# Patient Record
Sex: Male | Born: 1955 | Race: Black or African American | Hispanic: No | Marital: Married | Smoking: Current every day smoker
Health system: Southern US, Community
[De-identification: ages and names within clinical notes are randomized; demographics above are authoritative.]

## PROBLEM LIST (undated history)

## (undated) DIAGNOSIS — I10 Essential (primary) hypertension: Secondary | ICD-10-CM

## (undated) DIAGNOSIS — M5030 Other cervical disc degeneration, unspecified cervical region: Secondary | ICD-10-CM

## (undated) DIAGNOSIS — M1990 Unspecified osteoarthritis, unspecified site: Secondary | ICD-10-CM

## (undated) DIAGNOSIS — M5430 Sciatica, unspecified side: Secondary | ICD-10-CM

## (undated) DIAGNOSIS — G8929 Other chronic pain: Secondary | ICD-10-CM

---

## 2001-07-13 ENCOUNTER — Emergency Department (HOSPITAL_COMMUNITY): Admission: EM | Admit: 2001-07-13 | Discharge: 2001-07-13 | Payer: Self-pay

## 2001-07-13 ENCOUNTER — Encounter: Payer: Self-pay | Admitting: Emergency Medicine

## 2003-01-14 ENCOUNTER — Encounter: Payer: Self-pay | Admitting: Occupational Medicine

## 2003-01-14 ENCOUNTER — Encounter: Admission: RE | Admit: 2003-01-14 | Discharge: 2003-01-14 | Payer: Self-pay | Admitting: Occupational Medicine

## 2003-10-17 ENCOUNTER — Emergency Department (HOSPITAL_COMMUNITY): Admission: EM | Admit: 2003-10-17 | Discharge: 2003-10-18 | Payer: Self-pay | Admitting: Emergency Medicine

## 2006-12-20 ENCOUNTER — Emergency Department (HOSPITAL_COMMUNITY): Admission: EM | Admit: 2006-12-20 | Discharge: 2006-12-21 | Payer: Self-pay | Admitting: Emergency Medicine

## 2006-12-21 ENCOUNTER — Inpatient Hospital Stay (HOSPITAL_COMMUNITY): Admission: EM | Admit: 2006-12-21 | Discharge: 2006-12-21 | Payer: Self-pay | Admitting: Psychiatry

## 2006-12-21 ENCOUNTER — Ambulatory Visit: Payer: Self-pay | Admitting: Psychiatry

## 2009-04-23 ENCOUNTER — Emergency Department (HOSPITAL_COMMUNITY): Admission: EM | Admit: 2009-04-23 | Discharge: 2009-04-24 | Payer: Self-pay | Admitting: Emergency Medicine

## 2010-10-17 ENCOUNTER — Encounter: Payer: Self-pay | Admitting: Family Medicine

## 2011-01-02 LAB — URINALYSIS, ROUTINE W REFLEX MICROSCOPIC
Bilirubin Urine: NEGATIVE
Glucose, UA: NEGATIVE mg/dL
Hgb urine dipstick: NEGATIVE
Ketones, ur: NEGATIVE mg/dL
Nitrite: NEGATIVE
Protein, ur: NEGATIVE mg/dL
Specific Gravity, Urine: 1.015 (ref 1.005–1.030)
Urobilinogen, UA: 0.2 mg/dL (ref 0.0–1.0)
pH: 6.5 (ref 5.0–8.0)

## 2014-09-08 ENCOUNTER — Emergency Department (HOSPITAL_COMMUNITY)
Admission: EM | Admit: 2014-09-08 | Discharge: 2014-09-08 | Disposition: A | Discharge status: Home / Self Care | Payer: Medicaid Other | Attending: Emergency Medicine | Admitting: Emergency Medicine

## 2014-09-08 ENCOUNTER — Encounter (HOSPITAL_COMMUNITY): Payer: Self-pay | Admitting: Emergency Medicine

## 2014-09-08 DIAGNOSIS — I10 Essential (primary) hypertension: Secondary | ICD-10-CM | POA: Diagnosis not present

## 2014-09-08 DIAGNOSIS — Z720 Tobacco use: Secondary | ICD-10-CM | POA: Insufficient documentation

## 2014-09-08 DIAGNOSIS — M25511 Pain in right shoulder: Secondary | ICD-10-CM

## 2014-09-08 DIAGNOSIS — M79601 Pain in right arm: Secondary | ICD-10-CM | POA: Diagnosis present

## 2014-09-08 MED ORDER — IBUPROFEN 600 MG PO TABS: 600.0000 mg | ORAL_TABLET | Freq: Three times a day (TID) | ORAL | Status: DC | PRN

## 2014-09-08 MED ORDER — IBUPROFEN 800 MG PO TABS
800.0000 mg | ORAL_TABLET | Freq: Once | ORAL | Status: AC
  Administered 2014-09-08: 800 mg via ORAL
  Filled 2014-09-08: qty 1

## 2014-09-08 NOTE — Discharge Instructions (Signed)

## 2014-09-08 NOTE — ED Provider Notes (Signed)
CSN: [id number]     Arrival date & time 09/08/14  1036 History  This chart was scribed for Sharyon Cable, MD by Stephania Fragmin, ED Scribe. This patient was seen in room APA01/APA01 and the patient's care was started at 1:12 PM.    Chief Complaint  Patient presents with  . Arm Pain   Patient is a 58 y.o. male presenting with arm pain.  Arm Pain This is a new problem. The current episode started more than 1 week ago. The problem occurs constantly. The problem has been gradually worsening. Pertinent negatives include no chest pain, no abdominal pain, no headaches and no shortness of breath. Nothing aggravates the symptoms. Nothing relieves the symptoms. Treatments tried: Ibuprofen and BC's Arthritis. The treatment provided mild relief.     HPI Comments: TAVYN KURKA is a 58 y.o. male who presents to the Emergency Department complaining of constant, non-radiating, recently worsening right shoulder pain that "feels like it's in [his] bones" onset 2-3 months ago. He denies injury or heavy lifting. His pain worsens at night. He denies any effect movement has on his pain. Patient has tried BC's Arthritis and Advil with minimal relief. He denies fever, vomiting, chest pain, SOB, abdominal pain, weakness, numbness, neck pain, discoloration of arm, and cough. He also denies a history of a diagnosis of hypertension, after I mentioned his high BP reading taken here today. Patient denies having a PCP.    PMH - none  Family History  Problem Relation Age of Onset  . Asthma Father    History  Substance Use Topics  . Smoking status: Current Every Day Smoker -- 0.50 packs/day    Types: Cigarettes  . Smokeless tobacco: Never Used  . Alcohol Use: Yes     Comment: on football days    Review of Systems  Constitutional: Negative for fever.  Respiratory: Negative for cough and shortness of breath.   Cardiovascular: Negative for chest pain.  Gastrointestinal: Negative for vomiting and abdominal pain.   Musculoskeletal: Positive for myalgias. Negative for neck pain.  Skin: Negative for color change.  Neurological: Negative for weakness, numbness and headaches.  All other systems reviewed and are negative.     Allergies  Review of patient's allergies indicates no known allergies.  Home Medications   Prior to Admission medications   Not on File   BP 154/107 mmHg  Pulse 86  Temp(Src) 98.5 F (36.9 C) (Oral)  Resp 18  Ht 5\' 8"  (1.727 m)  Wt 204 lb (92.534 kg)  BMI 31.03 kg/m2  SpO2 95% Physical Exam   CONSTITUTIONAL: Well developed/well nourished HEAD: Normocephalic/atraumatic EYES: EOMI/PERRL ENMT: Mucous membranes moist NECK: supple no meningeal signs SPINE/BACK:entire spine nontender CV: S1/S2 noted, no murmurs/rubs/gallops noted LUNGS: Lungs are clear to auscultation bilaterally, no apparent distress ABDOMEN: soft, nontender, no rebound or guarding, bowel sounds noted throughout abdomen GU:no cva tenderness NEURO: Pt is awake/alert/appropriate, moves all extremitiesx4.  No facial droop. Equal power (5/5) with hand grip, wrist flex/extension, elbow flex/extension, and equal power with shoulder abduction/adduction.  No focal sensory deficit to light touch is noted in either UE.   Equal (2+) biceps/brachioradialis/tricep reflex in bilateral UE EXTREMITIES: pulses normal/equal, full ROM. Mild TTP of right shoulder, no warmth or edema. SKIN: warm, color normal PSYCH: no abnormalities of mood noted, alert and oriented to situation     ED Course  Procedures   DIAGNOSTIC STUDIES: Oxygen Saturation is 95% on room air, normal by my interpretation.    COORDINATION OF  CARE: 1:15 PM - Discussed treatment plan with pt at bedside which includes NSAID for a week and referral to a clinic for f/u of possible hypertension and pt agreed to plan. Pt advised of risk of untreated HTN and need for followup  Medications  ibuprofen (ADVIL,MOTRIN) tablet 800 mg (800 mg Oral Given  09/08/14 1326)      MDM   Final diagnoses:  Essential hypertension  Arthralgia of right shoulder region     I personally performed the services described in this documentation, which was scribed in my presence. The recorded information has been reviewed and is accurate.     Sharyon Cable, MD 09/08/14 [phone]

## 2014-09-08 NOTE — ED Notes (Signed)
Pt reports R shoulder pain with radiation into his arm, onset 2 months ago.

## 2014-09-08 NOTE — ED Notes (Signed)
Pt complain of pain circling his right shoulder. States it has been hurting for three months

## 2017-07-21 ENCOUNTER — Emergency Department (HOSPITAL_COMMUNITY): Payer: 59

## 2017-07-21 ENCOUNTER — Encounter (HOSPITAL_COMMUNITY): Payer: Self-pay

## 2017-07-21 ENCOUNTER — Observation Stay (HOSPITAL_COMMUNITY)
Admission: EM | Admit: 2017-07-21 | Discharge: 2017-07-22 | Disposition: A | Discharge status: Home / Self Care | Payer: 59 | Attending: Internal Medicine | Admitting: Internal Medicine

## 2017-07-21 DIAGNOSIS — R079 Chest pain, unspecified: Secondary | ICD-10-CM | POA: Diagnosis not present

## 2017-07-21 DIAGNOSIS — R0789 Other chest pain: Secondary | ICD-10-CM | POA: Insufficient documentation

## 2017-07-21 DIAGNOSIS — I160 Hypertensive urgency: Secondary | ICD-10-CM | POA: Diagnosis not present

## 2017-07-21 DIAGNOSIS — F17210 Nicotine dependence, cigarettes, uncomplicated: Secondary | ICD-10-CM | POA: Diagnosis not present

## 2017-07-21 DIAGNOSIS — Z79899 Other long term (current) drug therapy: Secondary | ICD-10-CM | POA: Insufficient documentation

## 2017-07-21 DIAGNOSIS — N182 Chronic kidney disease, stage 2 (mild): Secondary | ICD-10-CM | POA: Diagnosis not present

## 2017-07-21 DIAGNOSIS — Z720 Tobacco use: Secondary | ICD-10-CM | POA: Diagnosis not present

## 2017-07-21 DIAGNOSIS — I129 Hypertensive chronic kidney disease with stage 1 through stage 4 chronic kidney disease, or unspecified chronic kidney disease: Secondary | ICD-10-CM | POA: Insufficient documentation

## 2017-07-21 DIAGNOSIS — J45909 Unspecified asthma, uncomplicated: Secondary | ICD-10-CM | POA: Diagnosis not present

## 2017-07-21 HISTORY — DX: Essential (primary) hypertension: I10

## 2017-07-21 LAB — CBC
HEMATOCRIT: 47.1 % (ref 39.0–52.0)
Hemoglobin: 16.4 g/dL (ref 13.0–17.0)
MCH: 31.8 pg (ref 26.0–34.0)
MCHC: 34.8 g/dL (ref 30.0–36.0)
MCV: 91.3 fL (ref 78.0–100.0)
PLATELETS: 271 10*3/uL (ref 150–400)
RBC: 5.16 MIL/uL (ref 4.22–5.81)
RDW: 13.7 % (ref 11.5–15.5)
WBC: 8.3 10*3/uL (ref 4.0–10.5)

## 2017-07-21 LAB — URINALYSIS, ROUTINE W REFLEX MICROSCOPIC
BILIRUBIN URINE: NEGATIVE
Glucose, UA: NEGATIVE mg/dL
HGB URINE DIPSTICK: NEGATIVE
KETONES UR: NEGATIVE mg/dL
Leukocytes, UA: NEGATIVE
NITRITE: NEGATIVE
PH: 6 (ref 5.0–8.0)
Protein, ur: NEGATIVE mg/dL
Specific Gravity, Urine: 1.006 (ref 1.005–1.030)

## 2017-07-21 LAB — BASIC METABOLIC PANEL
Anion gap: 10 (ref 5–15)
BUN: 15 mg/dL (ref 6–20)
CHLORIDE: 101 mmol/L (ref 101–111)
CO2: 28 mmol/L (ref 22–32)
Calcium: 9.5 mg/dL (ref 8.9–10.3)
Creatinine, Ser: 1.35 mg/dL — ABNORMAL HIGH (ref 0.61–1.24)
GFR, EST NON AFRICAN AMERICAN: 56 mL/min — AB (ref >60)
Glucose, Bld: 98 mg/dL (ref 65–99)
POTASSIUM: 3.2 mmol/L — AB (ref 3.5–5.1)
SODIUM: 139 mmol/L (ref 135–145)

## 2017-07-21 LAB — I-STAT TROPONIN, ED: Troponin i, poc: 0.03 ng/mL (ref 0.00–0.08)

## 2017-07-21 LAB — RAPID URINE DRUG SCREEN, HOSP PERFORMED
Amphetamines: NOT DETECTED
BARBITURATES: NOT DETECTED
Benzodiazepines: NOT DETECTED
COCAINE: NOT DETECTED
Opiates: NOT DETECTED
Tetrahydrocannabinol: NOT DETECTED

## 2017-07-21 LAB — TROPONIN I
TROPONIN I: 0.04 ng/mL — AB (ref <0.03)
TROPONIN I: 0.04 ng/mL — AB (ref <0.03)
Troponin I: 0.04 ng/mL (ref <0.03)

## 2017-07-21 MED ORDER — SODIUM CHLORIDE 0.9% FLUSH
3.0000 mL | Freq: Two times a day (BID) | INTRAVENOUS | Status: DC
  Administered 2017-07-21 – 2017-07-22 (×2): 3 mL via INTRAVENOUS

## 2017-07-21 MED ORDER — HYDROCHLOROTHIAZIDE 25 MG PO TABS
25.0000 mg | ORAL_TABLET | Freq: Every day | ORAL | Status: DC
  Administered 2017-07-21 – 2017-07-22 (×2): 25 mg via ORAL
  Filled 2017-07-21 (×2): qty 1

## 2017-07-21 MED ORDER — ENOXAPARIN SODIUM 60 MG/0.6ML ~~LOC~~ SOLN
50.0000 mg | SUBCUTANEOUS | Status: DC
  Administered 2017-07-21: 22:00:00 50 mg via SUBCUTANEOUS
  Filled 2017-07-21: qty 0.6

## 2017-07-21 MED ORDER — POTASSIUM CHLORIDE CRYS ER 20 MEQ PO TBCR
40.0000 meq | EXTENDED_RELEASE_TABLET | Freq: Once | ORAL | Status: AC
  Administered 2017-07-21: 40 meq via ORAL
  Filled 2017-07-21: qty 2

## 2017-07-21 MED ORDER — ACETAMINOPHEN 650 MG RE SUPP: 650.0000 mg | Freq: Four times a day (QID) | RECTAL | Status: DC | PRN

## 2017-07-21 MED ORDER — SODIUM CHLORIDE 0.9 % IV SOLN: 250.0000 mL | INTRAVENOUS | Status: DC | PRN

## 2017-07-21 MED ORDER — VALSARTAN-HYDROCHLOROTHIAZIDE 160-25 MG PO TABS: 1.0000 | ORAL_TABLET | Freq: Every day | ORAL | Status: DC

## 2017-07-21 MED ORDER — ASPIRIN 81 MG PO CHEW
324.0000 mg | CHEWABLE_TABLET | Freq: Once | ORAL | Status: AC
  Administered 2017-07-21: 324 mg via ORAL
  Filled 2017-07-21: qty 4

## 2017-07-21 MED ORDER — HYDRALAZINE HCL 20 MG/ML IJ SOLN: 10.0000 mg | INTRAMUSCULAR | Status: DC | PRN

## 2017-07-21 MED ORDER — ACETAMINOPHEN 325 MG PO TABS: 650.0000 mg | ORAL_TABLET | Freq: Four times a day (QID) | ORAL | Status: DC | PRN

## 2017-07-21 MED ORDER — HYDROCODONE-ACETAMINOPHEN 5-325 MG PO TABS
1.0000 | ORAL_TABLET | Freq: Once | ORAL | Status: AC
  Administered 2017-07-21: 1 via ORAL
  Filled 2017-07-21: qty 1

## 2017-07-21 MED ORDER — HYDROCHLOROTHIAZIDE 25 MG PO TABS
25.0000 mg | ORAL_TABLET | Freq: Every day | ORAL | Status: DC
  Administered 2017-07-21 – 2017-07-22 (×2): 25 mg via ORAL
  Filled 2017-07-21 (×3): qty 1

## 2017-07-21 MED ORDER — NITROGLYCERIN 2 % TD OINT
1.0000 [in_us] | TOPICAL_OINTMENT | Freq: Once | TRANSDERMAL | Status: AC
  Administered 2017-07-21: 1 [in_us] via TOPICAL
  Filled 2017-07-21: qty 1

## 2017-07-21 MED ORDER — CYCLOBENZAPRINE HCL 10 MG PO TABS
5.0000 mg | ORAL_TABLET | Freq: Three times a day (TID) | ORAL | Status: DC | PRN
Start: 2017-07-21 — End: 2017-07-22
  Administered 2017-07-22 (×2): 5 mg via ORAL
  Filled 2017-07-21 (×2): qty 1

## 2017-07-21 MED ORDER — IRBESARTAN 150 MG PO TABS
150.0000 mg | ORAL_TABLET | Freq: Every day | ORAL | Status: DC
  Administered 2017-07-21 – 2017-07-22 (×2): 150 mg via ORAL
  Filled 2017-07-21 (×2): qty 1

## 2017-07-21 MED ORDER — IRBESARTAN 75 MG PO TABS
150.0000 mg | ORAL_TABLET | Freq: Every day | ORAL | Status: DC
  Administered 2017-07-21 – 2017-07-22 (×2): 150 mg via ORAL
  Filled 2017-07-21 (×5): qty 2

## 2017-07-21 MED ORDER — ONDANSETRON HCL 4 MG PO TABS: 4.0000 mg | ORAL_TABLET | Freq: Four times a day (QID) | ORAL | Status: DC | PRN

## 2017-07-21 MED ORDER — ONDANSETRON HCL 4 MG/2ML IJ SOLN: 4.0000 mg | Freq: Four times a day (QID) | INTRAMUSCULAR | Status: DC | PRN

## 2017-07-21 MED ORDER — SODIUM CHLORIDE 0.9% FLUSH: 3.0000 mL | INTRAVENOUS | Status: DC | PRN

## 2017-07-21 MED ORDER — DICLOFENAC SODIUM 1 % TD GEL
2.0000 g | Freq: Four times a day (QID) | TRANSDERMAL | Status: DC
  Administered 2017-07-21 – 2017-07-22 (×3): 2 g via TOPICAL
  Filled 2017-07-21: qty 100

## 2017-07-21 MED ORDER — NITROGLYCERIN 0.4 MG SL SUBL: 0.4000 mg | SUBLINGUAL_TABLET | SUBLINGUAL | Status: DC | PRN

## 2017-07-21 MED ORDER — INFLUENZA VAC SPLIT QUAD 0.5 ML IM SUSY
0.5000 mL | PREFILLED_SYRINGE | INTRAMUSCULAR | Status: DC
  Filled 2017-07-21: qty 0.5

## 2017-07-21 NOTE — ED Notes (Signed)
Report to Morgan, RN 

## 2017-07-21 NOTE — ED Notes (Signed)
Troponin 0.04, notified primary nurse and dr Thurnell Garbe with no new orders

## 2017-07-21 NOTE — H&P (Signed)
History and Physical    SCOUT GUMBS VOH:[id number] DOB: 02-Mar-1956 DOA: 07/21/2017  PCP: Patient, No Pcp Per   Patient coming from: Home  Chief Complaint:  Chest pain  HPI: Carl Garza is a 61 y.o. male with medical history significant of uncontrolled hypertension. Complains of five-day history of precordial chest pain, constant in nature, reaching up to a 10 out of 10 in intensity, described as dull, worse with movement of his left upper extremity, no worsening factors, no associated dyspnea, diaphoresis or palpitations. Nonexertional related.   Patient has uncontrolled hypertension, continues to smoke about 1 pack of cigarettes every 2 days. Denies any recent stress testing.    ED Course: Patient was found to have elevated blood pressure, mildly elevated troponins, he was referred for admission for evaluation.   Review of Systems:  1. General: No fevers, no chills, no weight gain or weight loss 2. ENT: No runny nose or sore throat, no hearing disturbances 3. Pulmonary: No dyspnea, cough, wheezing, or hemoptysis 4. Cardiovascular: No angina, claudication, lower extremity edema, pnd or orthopnea 5. Gastrointestinal: No nausea or vomiting, no diarrhea or constipation 6. Hematology: No easy bruisability or frequent infections 7. Urology: No dysuria, hematuria or increased urinary frequency 8. Dermatology: No rashes. 9. Neurology: No seizures or paresthesias 10. Musculoskeletal: No joint pain or deformities. Positive pain on movement of left upper extremity.   Past Medical History:  Diagnosis Date  . Hypertension     History reviewed. No pertinent surgical history.   reports that he has been smoking Cigarettes.  He has been smoking about 0.50 packs per day. He has never used smokeless tobacco. He reports that he drinks alcohol. He reports that he does not use drugs.  No Known Allergies  Family History  Problem Relation Age of Onset  . Asthma Father      Prior to  Admission medications   Medication Sig Start Date End Date Taking? Authorizing Provider  ibuprofen (ADVIL,MOTRIN) 600 MG tablet Take 1 tablet (600 mg total) by mouth every 8 (eight) hours as needed. 09/08/14  Yes Ripley Fraise, MD  valsartan-hydrochlorothiazide (DIOVAN-HCT) 160-25 MG tablet Take 1 tablet by mouth daily.   Yes [provider]    Physical Exam: Vitals:   07/21/17 1230 07/21/17 1323 07/21/17 1330 07/21/17 1336  BP: (!) 150/110 (!) 169/106 (!) 162/119 (!) 165/121  Pulse: 100 90 91 87  Resp: 19 18 18 18   Temp:      TempSrc:      SpO2: 96% 97% 97% 97%  Weight:      Height:        Constitutional: Not in pain or dyspnea Vitals:   07/21/17 1230 07/21/17 1323 07/21/17 1330 07/21/17 1336  BP: (!) 150/110 (!) 169/106 (!) 162/119 (!) 165/121  Pulse: 100 90 91 87  Resp: 19 18 18 18   Temp:      TempSrc:      SpO2: 96% 97% 97% 97%  Weight:      Height:       Eyes: PERRL, lids and conjunctivae normal Head normocephalic, nose and ears with no deformities.  ENMT: Mucous membranes are moist. Posterior pharynx clear of any exudate or lesions.Normal dentition.  Neck: normal, supple, no masses, no thyromegaly Respiratory: clear to auscultation bilaterally, no wheezing, no crackles. Normal respiratory effort. No accessory muscle use.  Cardiovascular: Regular rate and rhythm, no murmurs / rubs / gallops. No extremity edema. 2+ pedal pulses. No carotid bruits.  Abdomen: no tenderness, no  masses palpated. No hepatosplenomegaly. Bowel sounds positive.  Musculoskeletal: no clubbing / cyanosis. No joint deformity upper and lower extremities. Good ROM, no contractures. Normal muscle tone. Reproducible pain upon palpation of the left pectoralis muscle.  Skin: no rashes, lesions, ulcers. No induration Neurologic: CN 2-12 grossly intact. Sensation intact, DTR normal. Strength 5/5 in all 4.     Labs on Admission: I have personally reviewed following labs and imaging  studies  CBC:  Recent Labs Lab 07/21/17 1141  WBC 8.3  HGB 16.4  HCT 47.1  MCV 91.3  PLT 287   Basic Metabolic Panel:  Recent Labs Lab 07/21/17 1141  NA 139  K 3.2*  CL 101  CO2 28  GLUCOSE 98  BUN 15  CREATININE 1.35*  CALCIUM 9.5   GFR: Estimated Creatinine Clearance: 71.4 mL/min (A) (by C-G formula based on SCr of 1.35 mg/dL (H)). Liver Function Tests: No results for input(s): AST, ALT, ALKPHOS, BILITOT, PROT, ALBUMIN in the last 168 hours. No results for input(s): LIPASE, AMYLASE in the last 168 hours. No results for input(s): AMMONIA in the last 168 hours. Coagulation Profile: No results for input(s): INR, PROTIME in the last 168 hours. Cardiac Enzymes:  Recent Labs Lab 07/21/17 1234  TROPONINI 0.04*   BNP (last 3 results) No results for input(s): PROBNP in the last 8760 hours. HbA1C: No results for input(s): HGBA1C in the last 72 hours. CBG: No results for input(s): GLUCAP in the last 168 hours. Lipid Profile: No results for input(s): CHOL, HDL, LDLCALC, TRIG, CHOLHDL, LDLDIRECT in the last 72 hours. Thyroid Function Tests: No results for input(s): TSH, T4TOTAL, FREET4, T3FREE, THYROIDAB in the last 72 hours. Anemia Panel: No results for input(s): VITAMINB12, FOLATE, FERRITIN, TIBC, IRON, RETICCTPCT in the last 72 hours. Urine analysis:    Component Value Date/Time   COLORURINE YELLOW 04/23/2009 2349   APPEARANCEUR CLEAR 04/23/2009 2349   LABSPEC 1.015 04/23/2009 2349   PHURINE 6.5 04/23/2009 2349   GLUCOSEU NEGATIVE 04/23/2009 2349   HGBUR NEGATIVE 04/23/2009 2349   BILIRUBINUR NEGATIVE 04/23/2009 2349   KETONESUR NEGATIVE 04/23/2009 2349   PROTEINUR NEGATIVE 04/23/2009 2349   UROBILINOGEN 0.2 04/23/2009 2349   NITRITE NEGATIVE 04/23/2009 2349   LEUKOCYTESUR  04/23/2009 2349    NEGATIVE MICROSCOPIC NOT DONE ON URINES WITH NEGATIVE PROTEIN, BLOOD, LEUKOCYTES, NITRITE, OR GLUCOSE <1000 mg/dL.    Radiological Exams on Admission: Dg Chest 2  View  Result Date: 07/21/2017 CLINICAL DATA:  Chest pain and hypertension EXAM: CHEST  2 VIEW COMPARISON:  None. FINDINGS: Lungs are clear. Heart size and pulmonary vascularity are normal. No adenopathy. No pneumothorax. No bone lesions. IMPRESSION: No edema or consolidation. Electronically Signed   By: Lowella Grip III M.D.   On: 07/21/2017 12:15    EKG: Independently reviewed. Sinus rhythm, left axis deviation, lateral T-wave inversions.  Assessment/Plan Active Problems:   Chest pain  61 year old male who presents with left precordial chest pain. The pain is reproducible, nonexertional related, no suspicious symptoms. He does have uncontrolled hypertension and tobacco abuse. On initial physical examination blood pressure 183/127, heart rate 98, respiratory rate 22, oxygen saturation  98%. Moist mucous membranes, lungs clear to auscultation bilaterally no wheezing, rales  or rhonchi, heart S1-S2 present rhythmic, no rubs, gallops or murmurs, the abdomen soft nontender, no lower extremity edema. Sodium 139, potassium 3.2, chloride 11, bicarbonate 28, glucose 98, BUN 15, creatinine 1.35, troponin 0.04, white count 8.3, hemoglobin16.4, hematocrit 47.1, platelets 271. Chest x-ray personally reviewed, increased lung markings  bilaterally, no effusions, infiltrates or pneumothorax.  Patient will be admitted to the hospital working diagnosis of uncontrolled hypertension complicated by atypical chest pain and elevated troponins.  1. Uncontrolled hypertension. Will admit patient to the medical unit with a remote telemetry monitor, will resume valsartan/hydrochlorothiazide and will add as needed hydralazine 10 mg intravenously. Patient reports being noncompliant with his antihypertensive agents. Will assess the need to add a third agent depending on response to ARB and thiazide.   2. Atypical chest pain. Nonexertional related, will continue pain control and use of muscle relaxants, continue to trend  cardiac enzymes and do serial EKGs. Check lipid profile.   3. Chronic kidney disease stage II. Continue close monitoring of kidney function and electrolytes.   4. Tobacco abuse. Smoking cessation, concealing.    DVT prophylaxis: enoxaparin Code Status: full  Family Communication: no family at the bedside  Disposition Plan: home  Consults called: NA  Admission status: Observation    Mauricio Gerome Apley MD [facility] Pager [phone]  If 7PM-7AM, please contact night-coverage [email or web address] Password [email or web address]  07/21/2017, 1:56 PM

## 2017-07-21 NOTE — ED Triage Notes (Signed)
Reports of left sided chest pain that radiates down left arm x3 days. Denies any other complaints. Patient states his left hand cramps at times.

## 2017-07-21 NOTE — ED Provider Notes (Signed)
[facility] EMERGENCY DEPARTMENT Provider Note   CSN: [id number] Arrival date & time: 07/21/17  1116     History   Chief Complaint Chief Complaint  Patient presents with  . Chest Pain    HPI Carl Garza is a 61 y.o. male.  HPI  Pt was seen at 1140. Per pt, c/o gradual onset and persistence of waxing and waning left sided chest "pain" for the past 3 days. States the pain will occasionally radiate into his LUE. Pt also states he has not taken his BP meds in the past 1 week. Denies SOB/cough, no palpitations, no injury, no back pain, no abd pain, no N/V/D, no fevers, no rash.   Past Medical History:  Diagnosis Date  . Hypertension     There are no active problems to display for this patient.   History reviewed. No pertinent surgical history.     Home Medications    Prior to Admission medications   Medication Sig Start Date End Date Taking? Authorizing Provider  ibuprofen (ADVIL,MOTRIN) 600 MG tablet Take 1 tablet (600 mg total) by mouth every 8 (eight) hours as needed. 09/08/14  Yes Ripley Fraise, MD  valsartan-hydrochlorothiazide (DIOVAN-HCT) 160-25 MG tablet Take 1 tablet by mouth daily.   Yes [provider]    Family History Family History  Problem Relation Age of Onset  . Asthma Father     Social History Social History  Substance Use Topics  . Smoking status: Current Every Day Smoker    Packs/day: 0.50    Types: Cigarettes  . Smokeless tobacco: Never Used  . Alcohol use Yes     Comment: on football days     Allergies   Patient has no known allergies.   Review of Systems Review of Systems ROS: Statement: All systems negative except as marked or noted in the HPI; Constitutional: Negative for fever and chills. ; ; Eyes: Negative for eye pain, redness and discharge. ; ; ENMT: Negative for ear pain, hoarseness, nasal congestion, sinus pressure and sore throat. ; ; Cardiovascular: +CP. Negative for palpitations, diaphoresis, dyspnea and  peripheral edema. ; ; Respiratory: Negative for cough, wheezing and stridor. ; ; Gastrointestinal: Negative for nausea, vomiting, diarrhea, abdominal pain, blood in stool, hematemesis, jaundice and rectal bleeding. . ; ; Genitourinary: Negative for dysuria, flank pain and hematuria. ; ; Musculoskeletal: Negative for back pain and neck pain. Negative for swelling and trauma.; ; Skin: Negative for pruritus, rash, abrasions, blisters, bruising and skin lesion.; ; Neuro: Negative for headache, lightheadedness and neck stiffness. Negative for weakness, altered level of consciousness, altered mental status, extremity weakness, paresthesias, involuntary movement, seizure and syncope.       Physical Exam Updated Vital Signs BP (!) 165/121   Pulse 87   Temp 98.6 F (37 C) (Oral)   Resp 18   Ht 5\' 8"  (1.727 m)   Wt 108.9 kg (240 lb)   SpO2 97%   BMI 36.49 kg/m    Patient Vitals for the past 24 hrs:  BP Temp Temp src Pulse Resp SpO2 Height Weight  07/21/17 1336 (!) 165/121 - - 87 18 97 % - -  07/21/17 1330 (!) 162/119 - - 91 18 97 % - -  07/21/17 1323 (!) 169/106 - - 90 18 97 % - -  07/21/17 1230 (!) 150/110 - - 100 19 96 % - -  07/21/17 1137 (!) 174/117 - - (!) 101 16 95 % - -  07/21/17 1130 (!) 178/116 - - Marland Kitchen)  102 (!) 24 95 % - -  07/21/17 1125 - 98.6 F (37 C) Oral - - - - -  07/21/17 1123 - - - - - - 5\' 8"  (1.727 m) 108.9 kg (240 lb)     Physical Exam 1145: Physical examination:  Nursing notes reviewed; Vital signs and O2 SAT reviewed;  Constitutional: Well developed, Well nourished, Well hydrated, In no acute distress; Head:  Normocephalic, atraumatic; Eyes: EOMI, PERRL, No scleral icterus; ENMT: Mouth and pharynx normal, Mucous membranes moist; Neck: Supple, Full range of motion, No lymphadenopathy; Cardiovascular: Regular rate and rhythm, No gallop; Respiratory: Breath sounds clear & equal bilaterally, No wheezes.  Speaking full sentences with ease, Normal respiratory effort/excursion;  Chest: Nontender, Movement normal; Abdomen: Soft, Nontender, Nondistended, Normal bowel sounds; Genitourinary: No CVA tenderness; Extremities: Pulses normal, No tenderness, No edema, No calf edema or asymmetry.; Neuro: AA&Ox3, Major CN grossly intact.  Speech clear. No gross focal motor or sensory deficits in extremities.; Skin: Color normal, Warm, Dry.   ED Treatments / Results  Labs (all labs ordered are listed, but only abnormal results are displayed)   EKG  EKG Interpretation  Date/Time:  Friday July 21 2017 11:29:24 EDT Ventricular Rate:  101 PR Interval:    QRS Duration: 95 QT Interval:  358 QTC Calculation: 464 R Axis:   -37 Text Interpretation:  Sinus tachycardia Probable left atrial enlargement Left axis deviation RSR' in V1 or V2, probably normal variant LVH with secondary repolarization abnormality No old tracing to compare Confirmed by Francine Graven [phone]) on 07/21/2017 12:24:05 PM       Radiology   Procedures Procedures (including critical care time)  Medications Ordered in ED Medications  irbesartan (AVAPRO) tablet 150 mg (150 mg Oral Given 07/21/17 1240)  hydrochlorothiazide (HYDRODIURIL) tablet 25 mg (25 mg Oral Given 07/21/17 1231)  nitroGLYCERIN (NITROSTAT) SL tablet 0.4 mg (not administered)  potassium chloride SA (K-DUR,KLOR-CON) CR tablet 40 mEq (40 mEq Oral Given 07/21/17 1230)  HYDROcodone-acetaminophen (NORCO/VICODIN) 5-325 MG per tablet 1 tablet (1 tablet Oral Given 07/21/17 1229)  aspirin chewable tablet 324 mg (324 mg Oral Given 07/21/17 1336)     Initial Impression / Assessment and Plan / ED Course  I have reviewed the triage vital signs and the nursing notes.  Pertinent labs & imaging results that were available during my care of the patient were reviewed by me and considered in my medical decision making (see chart for details).  MDM Reviewed: previous chart, nursing note and vitals Interpretation: labs, ECG and x-ray   Results  for orders placed or performed during the hospital encounter of 37/90/24  Basic metabolic panel  Result Value Ref Range   Sodium 139 135 - 145 mmol/L   Potassium 3.2 (L) 3.5 - 5.1 mmol/L   Chloride 101 101 - 111 mmol/L   CO2 28 22 - 32 mmol/L   Glucose, Bld 98 65 - 99 mg/dL   BUN 15 6 - 20 mg/dL   Creatinine, Ser 1.35 (H) 0.61 - 1.24 mg/dL   Calcium 9.5 8.9 - 10.3 mg/dL   GFR calc non Af Amer 56 (L) >60 mL/min   GFR calc Af Amer >60 >60 mL/min   Anion gap 10 5 - 15  CBC  Result Value Ref Range   WBC 8.3 4.0 - 10.5 K/uL   RBC 5.16 4.22 - 5.81 MIL/uL   Hemoglobin 16.4 13.0 - 17.0 g/dL   HCT 47.1 39.0 - 52.0 %   MCV 91.3 78.0 - 100.0 fL  MCH 31.8 26.0 - 34.0 pg   MCHC 34.8 30.0 - 36.0 g/dL   RDW 13.7 11.5 - 15.5 %   Platelets 271 150 - 400 K/uL  Troponin I  Result Value Ref Range   Troponin I 0.04 (HH) <0.03 ng/mL  I-stat troponin, ED  Result Value Ref Range   Troponin i, poc 0.03 0.00 - 0.08 ng/mL   Comment 3           Dg Chest 2 View Result Date: 07/21/2017 CLINICAL DATA:  Chest pain and hypertension EXAM: CHEST  2 VIEW COMPARISON:  None. FINDINGS: Lungs are clear. Heart size and pulmonary vascularity are normal. No adenopathy. No pneumothorax. No bone lesions. IMPRESSION: No edema or consolidation. Electronically Signed   By: Lowella Grip III M.D.   On: 07/21/2017 12:15    1350:  2nd troponin mildly elevated. ASA and ntg given. Cr elevated; no old to compare. Pt's BP continues elevated despite dosing pt with is usual meds. Ntg paste added. Potassium repleted PO. Will admit. T/C to [facility]. Cathlean Sauer, case discussed, including:  HPI, pertinent PM/SHx, VS/PE, dx testing, ED course and treatment:  Agreeable to admit.      Final Clinical Impressions(s) / ED Diagnoses   Final diagnoses:  None    New Prescriptions New Prescriptions   No medications on file     Francine Graven, DO 07/22/17 1026

## 2017-07-21 NOTE — ED Notes (Signed)
Student in to assess

## 2017-07-22 DIAGNOSIS — I160 Hypertensive urgency: Secondary | ICD-10-CM | POA: Diagnosis not present

## 2017-07-22 DIAGNOSIS — R079 Chest pain, unspecified: Secondary | ICD-10-CM | POA: Diagnosis not present

## 2017-07-22 LAB — TROPONIN I
TROPONIN I: 0.04 ng/mL — AB (ref <0.03)
Troponin I: 0.04 ng/mL (ref <0.03)

## 2017-07-22 LAB — HIV ANTIBODY (ROUTINE TESTING W REFLEX): HIV Screen 4th Generation wRfx: NONREACTIVE

## 2017-07-22 MED ORDER — CYCLOBENZAPRINE HCL 5 MG PO TABS: 5.0000 mg | ORAL_TABLET | Freq: Three times a day (TID) | ORAL | 0 refills | Status: DC | PRN

## 2017-07-22 MED ORDER — HYDROCHLOROTHIAZIDE 25 MG PO TABS: 25.0000 mg | ORAL_TABLET | Freq: Every day | ORAL | 0 refills | Status: DC

## 2017-07-22 MED ORDER — OXYCODONE-ACETAMINOPHEN 5-325 MG PO TABS: 1.0000 | ORAL_TABLET | Freq: Four times a day (QID) | ORAL | 0 refills | Status: DC | PRN

## 2017-07-22 MED ORDER — LISINOPRIL 10 MG PO TABS: 10.0000 mg | ORAL_TABLET | Freq: Every day | ORAL | 0 refills | Status: DC

## 2017-07-22 MED ORDER — OXYCODONE-ACETAMINOPHEN 5-325 MG PO TABS
1.0000 | ORAL_TABLET | Freq: Four times a day (QID) | ORAL | Status: DC | PRN
  Administered 2017-07-22: 1 via ORAL
  Filled 2017-07-22 (×2): qty 1

## 2017-07-22 NOTE — Progress Notes (Signed)
Patient discharged in stable condition. IV and telemetry removed. Patient given discharge instructions and verbalized understanding. Patient denies the need for a wheelchair escort out.   Celestia Khat, RN

## 2017-07-22 NOTE — Discharge Summary (Signed)
Physician Discharge Summary  Carl Garza ZMO:[id number] DOB: Feb 05, 1956 DOA: 07/21/2017  PCP: Patient, No Pcp Per  Admit date: 07/21/2017 Discharge date: 07/22/2017  Admitted From: Home Disposition:  Home  Recommendations for Outpatient Follow-up:  1. Follow up with PCP in 1- weeks 2. Patient was counseled about salt restricted diet, and importance of being compliant with his medications. 3. Patient has been placed on lisinopril/hydrochlorothiazide, unable to afford Diovan anymore  Home Health: No Equipment/Devices: No   Discharge Condition: Stable CODE STATUS: Full  Diet recommendation: Heart healthy  Brief/Interim Summary: 61 year old male who presents with left precordial chest pain. The pain is reproducible, nonexertional related, no associated symptoms. He does have uncontrolled hypertension and tobacco abuse. On initial physical examination blood pressure 183/127, heart rate 98, respiratory rate 22, oxygen saturation  98%. Moist mucous membranes, lungs clear to auscultation bilaterally no wheezing, rales  or rhonchi, heart S1-S2 present rhythmic, no rubs, gallops or murmurs, the abdomen soft nontender, no lower extremity edema. Sodium 139, potassium 3.2, chloride 11, bicarbonate 28, glucose 98, BUN 15, creatinine 1.35, troponin 0.04, white count 8.3, hemoglobin 16.4, hematocrit 47.1, platelets 271. Chest x-ray personally reviewed, increased lung markings bilaterally, no effusions, infiltrates or pneumothorax.  Patient was admitted to the hospital working diagnosis of uncontrolled hypertension/ hypertensive urgency complicated by atypical chest pain and elevated troponins.   1. Hypertensive urgency. Patient was admitted to the medical unit, placed on a remote telemetry monitor, he resumed his antihypertensive agents with irbesartan and hydrochlorothiazide with significant improvement of his blood pressure. Discharge blood pressure 133/92. At home patient was not able to afford  his medications. He will be discharged on lisinopril and hydrochlorothiazide, in order to afford his medications at the local pharmacy under the $4 program. Considering his chronic kidney disease, will recommend slow up titration of ACE inhibitor, will start with 10 mg daily with a close follow-up as an outpatient.   2. Atypical chest pain. Reproducible, up on chest wall palpation likely musculoskeletal. His elevated troponins probably a reflection of uncontrolled hypertension combined with chronic kidney disease. His electrocardiogram remained stable, positive for left axis deviation, lateral T-wave inversions and repolarization changes likely related to left ventricular hypertrophy.   Will recommend continue as needed Flexeril and for severe pain oxycodone/acetaminophen.   3. Stage II chronic kidney disease. Kidney function remained stable, recommend to avoid NSAIDs and other nephrotoxic agents. Continue blood pressure control.  4. Tobacco abuse. Smoking cessation counseling.  Discharge Diagnoses:  Active Problems:   Chest pain    Discharge Instructions   Allergies as of 07/22/2017   No Known Allergies     Medication List    STOP taking these medications   ibuprofen 600 MG tablet Commonly known as:  ADVIL,MOTRIN     TAKE these medications   cyclobenzaprine 5 MG tablet Commonly known as:  FLEXERIL Take 1 tablet (5 mg total) by mouth 3 (three) times daily as needed for muscle spasms (left pectoralis pain).   hydrochlorothiazide 25 MG tablet Commonly known as:  HYDRODIURIL Take 1 tablet (25 mg total) by mouth daily.   lisinopril 10 MG tablet Commonly known as:  PRINIVIL Take 1 tablet (10 mg total) by mouth daily.   oxyCODONE-acetaminophen 5-325 MG tablet Commonly known as:  PERCOCET/ROXICET Take 1 tablet by mouth every 6 (six) hours as needed for moderate pain.   valsartan-hydrochlorothiazide 160-25 MG tablet Commonly known as:  DIOVAN-HCT Take 1 tablet by mouth  daily.       No Known Allergies  Consultations:     Procedures/Studies: Dg Chest 2 View  Result Date: 07/21/2017 CLINICAL DATA:  Chest pain and hypertension EXAM: CHEST  2 VIEW COMPARISON:  None. FINDINGS: Lungs are clear. Heart size and pulmonary vascularity are normal. No adenopathy. No pneumothorax. No bone lesions. IMPRESSION: No edema or consolidation. Electronically Signed   By: Lowella Grip III M.D.   On: 07/21/2017 12:15       Subjective: Patient feeling better, left shoulder pain is controlled, no dyspnea or other chest pain, no nausea or vomiting.   Discharge Exam: Vitals:   07/21/17 2118 07/22/17 0510  BP: 136/84 (!) 133/92  Pulse: (!) 104 97  Resp: 18 18  Temp: 98.7 F (37.1 C) 98.4 F (36.9 C)  SpO2: 96% 97%   Vitals:   07/21/17 1400 07/21/17 1602 07/21/17 2118 07/22/17 0510  BP: (!) 181/138 (!) 158/119 136/84 (!) 133/92  Pulse: 96 93 (!) 104 97  Resp: 19 18 18 18   Temp:  98.3 F (36.8 C) 98.7 F (37.1 C) 98.4 F (36.9 C)  TempSrc:  Oral Oral Oral  SpO2: 99% 97% 96% 97%  Weight:      Height:        General: Pt is alert, awake, not in acute distress E ENT. No pallor or icterus Cardiovascular: RRR, S1/S2 +, no rubs, no gallops, no JVD.  Respiratory: CTA bilaterally, no wheezing, no rhonchi Abdominal: Soft, NT, ND, bowel sounds + Extremities: no edema, no cyanosis    The results of significant diagnostics from this hospitalization (including imaging, microbiology, ancillary and laboratory) are listed below for reference.     Microbiology: No results found for this or any previous visit (from the past 240 hour(s)).   Labs: BNP (last 3 results) No results for input(s): BNP in the last 8760 hours. Basic Metabolic Panel:  Recent Labs Lab 07/21/17 1141  NA 139  K 3.2*  CL 101  CO2 28  GLUCOSE 98  BUN 15  CREATININE 1.35*  CALCIUM 9.5   Liver Function Tests: No results for input(s): AST, ALT, ALKPHOS, BILITOT, PROT, ALBUMIN  in the last 168 hours. No results for input(s): LIPASE, AMYLASE in the last 168 hours. No results for input(s): AMMONIA in the last 168 hours. CBC:  Recent Labs Lab 07/21/17 1141  WBC 8.3  HGB 16.4  HCT 47.1  MCV 91.3  PLT 271   Cardiac Enzymes:  Recent Labs Lab 07/21/17 1234 07/21/17 1638 07/21/17 2155 07/22/17 0528  TROPONINI 0.04* 0.04* 0.04* 0.04*   BNP: Invalid input(s): POCBNP CBG: No results for input(s): GLUCAP in the last 168 hours. D-Dimer No results for input(s): DDIMER in the last 72 hours. Hgb A1c No results for input(s): HGBA1C in the last 72 hours. Lipid Profile No results for input(s): CHOL, HDL, LDLCALC, TRIG, CHOLHDL, LDLDIRECT in the last 72 hours. Thyroid function studies No results for input(s): TSH, T4TOTAL, T3FREE, THYROIDAB in the last 72 hours.  Invalid input(s): FREET3 Anemia work up No results for input(s): VITAMINB12, FOLATE, FERRITIN, TIBC, IRON, RETICCTPCT in the last 72 hours. Urinalysis    Component Value Date/Time   COLORURINE STRAW (A) 07/21/2017 1404   APPEARANCEUR CLEAR 07/21/2017 1404   LABSPEC 1.006 07/21/2017 1404   PHURINE 6.0 07/21/2017 1404   GLUCOSEU NEGATIVE 07/21/2017 1404   HGBUR NEGATIVE 07/21/2017 1404   BILIRUBINUR NEGATIVE 07/21/2017 1404   KETONESUR NEGATIVE 07/21/2017 1404   PROTEINUR NEGATIVE 07/21/2017 1404   UROBILINOGEN 0.2 04/23/2009 2349   NITRITE NEGATIVE 07/21/2017 1404  LEUKOCYTESUR NEGATIVE 07/21/2017 1404   Sepsis Labs Invalid input(s): PROCALCITONIN,  WBC,  LACTICIDVEN Microbiology No results found for this or any previous visit (from the past 240 hour(s)).   Time coordinating discharge: 45 minutes  SIGNED:   Tawni Millers, MD  [facility] 07/22/2017, 10:51 AM Pager [phone]  If 7PM-7AM, please contact night-coverage [email or web address] Password [email or web address]

## 2017-11-04 ENCOUNTER — Observation Stay (HOSPITAL_COMMUNITY)
Admission: EM | Admit: 2017-11-04 | Discharge: 2017-11-06 | Disposition: A | Discharge status: Home / Self Care | Payer: 59 | Attending: General Surgery | Admitting: General Surgery

## 2017-11-04 ENCOUNTER — Emergency Department (HOSPITAL_COMMUNITY): Payer: 59

## 2017-11-04 ENCOUNTER — Encounter (HOSPITAL_COMMUNITY): Payer: Self-pay | Admitting: Emergency Medicine

## 2017-11-04 ENCOUNTER — Emergency Department (HOSPITAL_COMMUNITY): Payer: 59 | Admitting: Anesthesiology

## 2017-11-04 ENCOUNTER — Encounter (HOSPITAL_COMMUNITY)
Admission: EM | Disposition: A | Discharge status: Home / Self Care | Payer: Self-pay | Source: Home / Self Care | Attending: Emergency Medicine

## 2017-11-04 ENCOUNTER — Other Ambulatory Visit: Payer: Self-pay

## 2017-11-04 DIAGNOSIS — D170 Benign lipomatous neoplasm of skin and subcutaneous tissue of head, face and neck: Secondary | ICD-10-CM | POA: Insufficient documentation

## 2017-11-04 DIAGNOSIS — F17210 Nicotine dependence, cigarettes, uncomplicated: Secondary | ICD-10-CM | POA: Diagnosis not present

## 2017-11-04 DIAGNOSIS — I6529 Occlusion and stenosis of unspecified carotid artery: Secondary | ICD-10-CM | POA: Diagnosis not present

## 2017-11-04 DIAGNOSIS — Z79899 Other long term (current) drug therapy: Secondary | ICD-10-CM | POA: Insufficient documentation

## 2017-11-04 DIAGNOSIS — K5900 Constipation, unspecified: Secondary | ICD-10-CM | POA: Insufficient documentation

## 2017-11-04 DIAGNOSIS — I10 Essential (primary) hypertension: Secondary | ICD-10-CM | POA: Diagnosis not present

## 2017-11-04 DIAGNOSIS — K3580 Unspecified acute appendicitis: Principal | ICD-10-CM

## 2017-11-04 HISTORY — PX: LAPAROSCOPIC APPENDECTOMY: SHX408

## 2017-11-04 LAB — COMPREHENSIVE METABOLIC PANEL
ALK PHOS: 80 U/L (ref 38–126)
ALT: 27 U/L (ref 17–63)
AST: 19 U/L (ref 15–41)
Albumin: 4.2 g/dL (ref 3.5–5.0)
Anion gap: 13 (ref 5–15)
BILIRUBIN TOTAL: 1.3 mg/dL — AB (ref 0.3–1.2)
BUN: 23 mg/dL — ABNORMAL HIGH (ref 6–20)
CALCIUM: 9.4 mg/dL (ref 8.9–10.3)
CO2: 23 mmol/L (ref 22–32)
CREATININE: 1.45 mg/dL — AB (ref 0.61–1.24)
Chloride: 101 mmol/L (ref 101–111)
GFR, EST AFRICAN AMERICAN: 59 mL/min — AB (ref >60)
GFR, EST NON AFRICAN AMERICAN: 51 mL/min — AB (ref >60)
Glucose, Bld: 89 mg/dL (ref 65–99)
Potassium: 3.4 mmol/L — ABNORMAL LOW (ref 3.5–5.1)
Sodium: 137 mmol/L (ref 135–145)
TOTAL PROTEIN: 8.1 g/dL (ref 6.5–8.1)

## 2017-11-04 LAB — CBC WITH DIFFERENTIAL/PLATELET
BASOS ABS: 0 10*3/uL (ref 0.0–0.1)
Basophils Relative: 0 %
EOS ABS: 0 10*3/uL (ref 0.0–0.7)
EOS PCT: 0 %
HCT: 40.4 % (ref 39.0–52.0)
Hemoglobin: 13.5 g/dL (ref 13.0–17.0)
Lymphocytes Relative: 16 %
Lymphs Abs: 2.1 10*3/uL (ref 0.7–4.0)
MCH: 30.2 pg (ref 26.0–34.0)
MCHC: 33.4 g/dL (ref 30.0–36.0)
MCV: 90.4 fL (ref 78.0–100.0)
Monocytes Absolute: 0.8 10*3/uL (ref 0.1–1.0)
Monocytes Relative: 6 %
Neutro Abs: 10.1 10*3/uL — ABNORMAL HIGH (ref 1.7–7.7)
Neutrophils Relative %: 78 %
PLATELETS: 296 10*3/uL (ref 150–400)
RBC: 4.47 MIL/uL (ref 4.22–5.81)
RDW: 14.3 % (ref 11.5–15.5)
WBC: 13.1 10*3/uL — AB (ref 4.0–10.5)

## 2017-11-04 LAB — LIPASE, BLOOD: LIPASE: 30 U/L (ref 11–51)

## 2017-11-04 SURGERY — APPENDECTOMY, LAPAROSCOPIC
Anesthesia: General

## 2017-11-04 MED ORDER — ERTAPENEM SODIUM 1 G IJ SOLR
1.0000 g | INTRAMUSCULAR | Status: DC
  Administered 2017-11-05: 1 g via INTRAVENOUS
  Filled 2017-11-04 (×2): qty 1

## 2017-11-04 MED ORDER — LORAZEPAM 2 MG/ML IJ SOLN: 1.0000 mg | INTRAMUSCULAR | Status: DC | PRN

## 2017-11-04 MED ORDER — ONDANSETRON HCL 4 MG/2ML IJ SOLN
INTRAMUSCULAR | Status: DC | PRN
  Administered 2017-11-04: 4 mg via INTRAVENOUS

## 2017-11-04 MED ORDER — MIDAZOLAM HCL 5 MG/5ML IJ SOLN
INTRAMUSCULAR | Status: DC | PRN
  Administered 2017-11-04: 2 mg via INTRAVENOUS

## 2017-11-04 MED ORDER — CHLORHEXIDINE GLUCONATE CLOTH 2 % EX PADS: 6.0000 | MEDICATED_PAD | Freq: Once | CUTANEOUS | Status: DC

## 2017-11-04 MED ORDER — ROCURONIUM BROMIDE 50 MG/5ML IV SOLN
INTRAVENOUS | Status: AC
  Filled 2017-11-04: qty 1

## 2017-11-04 MED ORDER — SODIUM CHLORIDE 0.9 % IV SOLN
1.0000 g | Freq: Once | INTRAVENOUS | Status: AC
  Administered 2017-11-04: 1 g via INTRAVENOUS
  Filled 2017-11-04: qty 1

## 2017-11-04 MED ORDER — ACETAMINOPHEN 500 MG PO TABS
1000.0000 mg | ORAL_TABLET | Freq: Four times a day (QID) | ORAL | Status: DC
  Administered 2017-11-04 – 2017-11-06 (×6): 1000 mg via ORAL
  Filled 2017-11-04 (×7): qty 2

## 2017-11-04 MED ORDER — ONDANSETRON HCL 4 MG/2ML IJ SOLN: 4.0000 mg | Freq: Four times a day (QID) | INTRAMUSCULAR | Status: DC | PRN

## 2017-11-04 MED ORDER — EZETIMIBE 10 MG PO TABS
10.0000 mg | ORAL_TABLET | Freq: Every day | ORAL | Status: DC
  Administered 2017-11-04 – 2017-11-06 (×3): 10 mg via ORAL
  Filled 2017-11-04 (×3): qty 1

## 2017-11-04 MED ORDER — HYDROMORPHONE HCL 1 MG/ML IJ SOLN
1.0000 mg | INTRAMUSCULAR | Status: DC | PRN
  Administered 2017-11-04 – 2017-11-06 (×7): 1 mg via INTRAVENOUS
  Filled 2017-11-04 (×7): qty 1

## 2017-11-04 MED ORDER — METOCLOPRAMIDE HCL 5 MG/ML IJ SOLN
INTRAMUSCULAR | Status: AC
  Filled 2017-11-04: qty 4

## 2017-11-04 MED ORDER — IRBESARTAN 150 MG PO TABS
150.0000 mg | ORAL_TABLET | Freq: Every day | ORAL | Status: DC
  Administered 2017-11-05 – 2017-11-06 (×2): 150 mg via ORAL
  Filled 2017-11-04 (×2): qty 1

## 2017-11-04 MED ORDER — ONDANSETRON 4 MG PO TBDP: 4.0000 mg | ORAL_TABLET | Freq: Four times a day (QID) | ORAL | Status: DC | PRN

## 2017-11-04 MED ORDER — LACTATED RINGERS IV SOLN
INTRAVENOUS | Status: DC
  Administered 2017-11-04: 16:00:00 via INTRAVENOUS

## 2017-11-04 MED ORDER — SUCCINYLCHOLINE CHLORIDE 20 MG/ML IJ SOLN
INTRAMUSCULAR | Status: DC | PRN
  Administered 2017-11-04: 150 mg via INTRAVENOUS

## 2017-11-04 MED ORDER — ONDANSETRON HCL 4 MG/2ML IJ SOLN
INTRAMUSCULAR | Status: AC
  Filled 2017-11-04: qty 2

## 2017-11-04 MED ORDER — LIDOCAINE HCL (PF) 1 % IJ SOLN
INTRAMUSCULAR | Status: AC
  Filled 2017-11-04: qty 5

## 2017-11-04 MED ORDER — PROPOFOL 10 MG/ML IV BOLUS
INTRAVENOUS | Status: AC
  Filled 2017-11-04: qty 20

## 2017-11-04 MED ORDER — KETOROLAC TROMETHAMINE 30 MG/ML IJ SOLN: 30.0000 mg | Freq: Four times a day (QID) | INTRAMUSCULAR | Status: DC | PRN

## 2017-11-04 MED ORDER — LACTATED RINGERS IV SOLN
INTRAVENOUS | Status: DC
  Administered 2017-11-04 – 2017-11-05 (×2): via INTRAVENOUS

## 2017-11-04 MED ORDER — FENTANYL CITRATE (PF) 100 MCG/2ML IJ SOLN
INTRAMUSCULAR | Status: DC | PRN
  Administered 2017-11-04 (×2): 50 ug via INTRAVENOUS

## 2017-11-04 MED ORDER — PROPOFOL 10 MG/ML IV BOLUS
INTRAVENOUS | Status: DC | PRN
  Administered 2017-11-04: 130 mg via INTRAVENOUS

## 2017-11-04 MED ORDER — IOPAMIDOL (ISOVUE-300) INJECTION 61%
100.0000 mL | Freq: Once | INTRAVENOUS | Status: AC | PRN
  Administered 2017-11-04: 100 mL via INTRAVENOUS

## 2017-11-04 MED ORDER — SODIUM CHLORIDE 0.9 % IR SOLN
Status: DC | PRN
  Administered 2017-11-04: 1000 mL

## 2017-11-04 MED ORDER — SUGAMMADEX SODIUM 500 MG/5ML IV SOLN
INTRAVENOUS | Status: DC | PRN
  Administered 2017-11-04: 400 mg via INTRAVENOUS

## 2017-11-04 MED ORDER — AMLODIPINE BESYLATE 5 MG PO TABS
10.0000 mg | ORAL_TABLET | Freq: Every day | ORAL | Status: DC
  Administered 2017-11-04 – 2017-11-06 (×3): 10 mg via ORAL
  Filled 2017-11-04 (×3): qty 2

## 2017-11-04 MED ORDER — METOCLOPRAMIDE HCL 5 MG/ML IJ SOLN
INTRAMUSCULAR | Status: DC | PRN
  Administered 2017-11-04: 20 mg via INTRAVENOUS

## 2017-11-04 MED ORDER — VALSARTAN-HYDROCHLOROTHIAZIDE 160-25 MG PO TABS: 1.0000 | ORAL_TABLET | Freq: Every day | ORAL | Status: DC

## 2017-11-04 MED ORDER — POVIDONE-IODINE 10 % EX OINT
TOPICAL_OINTMENT | CUTANEOUS | Status: AC
  Filled 2017-11-04: qty 1

## 2017-11-04 MED ORDER — ENOXAPARIN SODIUM 40 MG/0.4ML ~~LOC~~ SOLN
40.0000 mg | SUBCUTANEOUS | Status: DC
  Administered 2017-11-06: 40 mg via SUBCUTANEOUS
  Filled 2017-11-04 (×2): qty 0.4

## 2017-11-04 MED ORDER — ONDANSETRON HCL 4 MG/2ML IJ SOLN
4.0000 mg | Freq: Once | INTRAMUSCULAR | Status: AC
  Administered 2017-11-04: 4 mg via INTRAVENOUS
  Filled 2017-11-04: qty 2

## 2017-11-04 MED ORDER — LIDOCAINE HCL 1 % IJ SOLN
INTRAMUSCULAR | Status: DC | PRN
  Administered 2017-11-04: 30 mg via INTRADERMAL

## 2017-11-04 MED ORDER — NICOTINE 21 MG/24HR TD PT24
21.0000 mg | MEDICATED_PATCH | Freq: Every day | TRANSDERMAL | Status: DC
  Administered 2017-11-04 – 2017-11-06 (×3): 21 mg via TRANSDERMAL
  Filled 2017-11-04 (×3): qty 1

## 2017-11-04 MED ORDER — HYDROCHLOROTHIAZIDE 25 MG PO TABS
25.0000 mg | ORAL_TABLET | Freq: Every day | ORAL | Status: DC
  Administered 2017-11-05 – 2017-11-06 (×2): 25 mg via ORAL
  Filled 2017-11-04 (×2): qty 1

## 2017-11-04 MED ORDER — MORPHINE SULFATE (PF) 4 MG/ML IV SOLN
4.0000 mg | Freq: Once | INTRAVENOUS | Status: AC
  Administered 2017-11-04: 4 mg via INTRAVENOUS
  Filled 2017-11-04: qty 1

## 2017-11-04 MED ORDER — SIMETHICONE 80 MG PO CHEW: 40.0000 mg | CHEWABLE_TABLET | Freq: Four times a day (QID) | ORAL | Status: DC | PRN

## 2017-11-04 MED ORDER — MIDAZOLAM HCL 2 MG/2ML IJ SOLN
INTRAMUSCULAR | Status: AC
  Filled 2017-11-04: qty 2

## 2017-11-04 MED ORDER — HYDROCODONE-ACETAMINOPHEN 5-325 MG PO TABS: 1.0000 | ORAL_TABLET | ORAL | Status: DC | PRN

## 2017-11-04 MED ORDER — BUPIVACAINE HCL (PF) 0.5 % IJ SOLN
INTRAMUSCULAR | Status: DC | PRN
  Administered 2017-11-04: 17:00:00

## 2017-11-04 MED ORDER — FENTANYL CITRATE (PF) 250 MCG/5ML IJ SOLN
INTRAMUSCULAR | Status: AC
  Filled 2017-11-04: qty 5

## 2017-11-04 MED ORDER — ROCURONIUM BROMIDE 100 MG/10ML IV SOLN
INTRAVENOUS | Status: DC | PRN
  Administered 2017-11-04: 5 mg via INTRAVENOUS
  Administered 2017-11-04: 20 mg via INTRAVENOUS

## 2017-11-04 SURGICAL SUPPLY — 57 items
ADH SKN CLS APL DERMABOND .7 (GAUZE/BANDAGES/DRESSINGS) ×1
BAG HAMPER (MISCELLANEOUS) ×3 IMPLANT
BAG RETRIEVAL 10 (BASKET) ×1
BAG RETRIEVAL 10MM (BASKET) ×1
CHLORAPREP W/TINT 26ML (MISCELLANEOUS) ×3 IMPLANT
CLOTH BEACON ORANGE TIMEOUT ST (SAFETY) ×3 IMPLANT
COVER LIGHT HANDLE STERIS (MISCELLANEOUS) ×6 IMPLANT
CUTTER FLEX LINEAR 45M (STAPLE) ×3 IMPLANT
DECANTER SPIKE VIAL GLASS SM (MISCELLANEOUS) ×3 IMPLANT
DERMABOND ADVANCED (GAUZE/BANDAGES/DRESSINGS) ×2
DERMABOND ADVANCED .7 DNX12 (GAUZE/BANDAGES/DRESSINGS) IMPLANT
ELECT REM PT RETURN 9FT ADLT (ELECTROSURGICAL) ×3
ELECTRODE REM PT RTRN 9FT ADLT (ELECTROSURGICAL) ×1 IMPLANT
EVACUATOR SMOKE 8.L (FILTER) ×3 IMPLANT
FORMALIN 10 PREFIL 120ML (MISCELLANEOUS) ×3 IMPLANT
GLOVE BIOGEL PI IND STRL 6.5 (GLOVE) IMPLANT
GLOVE BIOGEL PI IND STRL 7.0 (GLOVE) ×2 IMPLANT
GLOVE BIOGEL PI IND STRL 7.5 (GLOVE) IMPLANT
GLOVE BIOGEL PI INDICATOR 6.5 (GLOVE) ×2
GLOVE BIOGEL PI INDICATOR 7.0 (GLOVE) ×4
GLOVE BIOGEL PI INDICATOR 7.5 (GLOVE) ×4
GLOVE ECLIPSE 6.5 STRL STRAW (GLOVE) ×2 IMPLANT
GLOVE SURG SS PI 7.5 STRL IVOR (GLOVE) ×3 IMPLANT
GOWN STRL REUS W/ TWL XL LVL3 (GOWN DISPOSABLE) ×1 IMPLANT
GOWN STRL REUS W/TWL LRG LVL3 (GOWN DISPOSABLE) ×3 IMPLANT
GOWN STRL REUS W/TWL XL LVL3 (GOWN DISPOSABLE) ×3
INST SET LAPROSCOPIC AP (KITS) ×3 IMPLANT
IV NS IRRIG 3000ML ARTHROMATIC (IV SOLUTION) IMPLANT
KIT ROOM TURNOVER APOR (KITS) ×3 IMPLANT
MANIFOLD NEPTUNE II (INSTRUMENTS) ×3 IMPLANT
NDL INSUFFLATION 14GA 120MM (NEEDLE) ×1 IMPLANT
NEEDLE INSUFFLATION 14GA 120MM (NEEDLE) ×3 IMPLANT
NS IRRIG 1000ML POUR BTL (IV SOLUTION) ×3 IMPLANT
PACK LAP CHOLE LZT030E (CUSTOM PROCEDURE TRAY) ×3 IMPLANT
PAD ARMBOARD 7.5X6 YLW CONV (MISCELLANEOUS) ×3 IMPLANT
PENCIL HANDSWITCHING (ELECTRODE) ×3 IMPLANT
RELOAD 45 VASCULAR/THIN (ENDOMECHANICALS) IMPLANT
RELOAD STAPLE 45 2.5 WHT GRN (ENDOMECHANICALS) IMPLANT
RELOAD STAPLE 45 3.5 BLU ETS (ENDOMECHANICALS) IMPLANT
RELOAD STAPLE TA45 3.5 REG BLU (ENDOMECHANICALS) ×3 IMPLANT
SET BASIN LINEN APH (SET/KITS/TRAYS/PACK) ×3 IMPLANT
SET TUBE IRRIG SUCTION NO TIP (IRRIGATION / IRRIGATOR) IMPLANT
SHEARS HARMONIC ACE PLUS 36CM (ENDOMECHANICALS) ×3 IMPLANT
SPONGE GAUZE 2X2 8PLY STER LF (GAUZE/BANDAGES/DRESSINGS) ×3
SPONGE GAUZE 2X2 8PLY STRL LF (GAUZE/BANDAGES/DRESSINGS) ×6 IMPLANT
STAPLER VISISTAT (STAPLE) ×3 IMPLANT
SUT VICRYL 0 UR6 27IN ABS (SUTURE) ×3 IMPLANT
SYR 20CC LL (SYRINGE) ×2 IMPLANT
SYS BAG RETRIEVAL 10MM (BASKET) ×1
SYSTEM BAG RETRIEVAL 10MM (BASKET) ×1 IMPLANT
TRAY FOLEY CATH SILVER 16FR (SET/KITS/TRAYS/PACK) ×3 IMPLANT
TROCAR ENDO BLADELESS 11MM (ENDOMECHANICALS) ×3 IMPLANT
TROCAR ENDO BLADELESS 12MM (ENDOMECHANICALS) ×3 IMPLANT
TROCAR XCEL NON-BLD 5MMX100MML (ENDOMECHANICALS) ×3 IMPLANT
TUBING INSUFFLATION (TUBING) ×3 IMPLANT
WARMER LAPAROSCOPE (MISCELLANEOUS) ×3 IMPLANT
YANKAUER SUCT 12FT TUBE ARGYLE (SUCTIONS) ×3 IMPLANT

## 2017-11-04 NOTE — ED Provider Notes (Signed)
[facility] EMERGENCY DEPARTMENT Provider Note   CSN: [id number] Arrival date & time: 11/04/17  5176     History   Chief Complaint Chief Complaint  Patient presents with  . Neck Pain    HPI ROSHARD REZABEK is a 62 y.o. male.  HPI   MAIKEL NEISLER is a 62 y.o. male who presents to the Emergency Department complaining of gradual onset of lower abdominal pain for 3-4 days.  He states the pain became more severe and localized to his right lower abdomen last evening.  He describes the pain as constant, it is not associated with food intake.  Worse with ambulation, nothing makes the pain better.  He also complains of neck pain and headache for 1 week.  He states that he noticed a "knot" to the left side of his neck and he is concerned that this may be the source of his neck pain and headache.  He denies numbness or tingling of the upper extremities.  He also denies chest pain, fever, chills, vomiting and nausea.  Last meal was 7 PM last evening.  No previous abdominal surgeries.   Past Medical History:  Diagnosis Date  . Hypertension     Patient Active Problem List   Diagnosis Date Noted  . Chest pain 07/21/2017    History reviewed. No pertinent surgical history.     Home Medications    Prior to Admission medications   Medication Sig Start Date End Date Taking? Authorizing Provider  amLODipine (NORVASC) 10 MG tablet Take 10 mg by mouth daily. 09/23/17  Yes [provider]  ezetimibe (ZETIA) 10 MG tablet Take 10 mg by mouth daily. 10/03/17  Yes [provider]  valsartan-hydrochlorothiazide (DIOVAN-HCT) 160-25 MG tablet Take 1 tablet by mouth daily.   Yes [provider]  lisinopril (PRINIVIL) 10 MG tablet Take 1 tablet (10 mg total) by mouth daily. 07/22/17 08/21/17  Arrien, Jimmy Picket, MD    Family History Family History  Problem Relation Age of Onset  . Asthma Father     Social History Social History   Tobacco Use  . Smoking  status: Current Every Day Smoker    Packs/day: 0.50    Types: Cigarettes  . Smokeless tobacco: Never Used  Substance Use Topics  . Alcohol use: Yes    Comment: on football days  . Drug use: No     Allergies   Patient has no known allergies.   Review of Systems Review of Systems  Constitutional: Negative for appetite change, chills and fever.  Respiratory: Negative for shortness of breath.   Cardiovascular: Negative for chest pain.  Gastrointestinal: Positive for abdominal pain. Negative for blood in stool, nausea and vomiting.  Genitourinary: Negative for decreased urine volume, difficulty urinating, dysuria and flank pain.  Musculoskeletal: Positive for neck pain. Negative for back pain.  Skin: Negative for color change and rash.  Neurological: Negative for dizziness, weakness and numbness.  Hematological: Negative for adenopathy.  All other systems reviewed and are negative.    Physical Exam Updated Vital Signs BP 117/85 (BP Location: Right Arm)   Pulse 96   Temp 98.1 F (36.7 C) (Oral)   Resp 18   Ht 5\' 8"  (1.727 m)   Wt 92.5 kg (204 lb)   SpO2 98%   BMI 31.02 kg/m   Physical Exam  Constitutional: He is oriented to person, place, and time. He appears well-developed and well-nourished. No distress.  HENT:  Head: Atraumatic.  Mouth/Throat: Oropharynx is clear  and moist.  Eyes: EOM are normal. Pupils are equal, round, and reactive to light.  Neck: No JVD present.  Firm mobile, palpable mass to the left C-spine.  No bony tenderness.  No erythema or edema of the skin.  Patient has full range of motion of the neck  Cardiovascular: Normal rate, regular rhythm and intact distal pulses.  No murmur heard. Pulmonary/Chest: Effort normal and breath sounds normal. No respiratory distress.  Abdominal: Soft. There is tenderness.  Tenderness of the right lower quadrant upon palpation.  No guarding.  No rebound tenderness.  Abdomen remains soft, w/o peritoneal signs.    Musculoskeletal: Normal range of motion. He exhibits no tenderness.  Neurological: He is alert and oriented to person, place, and time. No sensory deficit.  Skin: Skin is warm. Capillary refill takes less than 2 seconds.  Nursing note and vitals reviewed.    ED Treatments / Results  Labs (all labs ordered are listed, but only abnormal results are displayed) Labs Reviewed  COMPREHENSIVE METABOLIC PANEL - Abnormal; Notable for the following components:      Result Value   Potassium 3.4 (*)    BUN 23 (*)    Creatinine, Ser 1.45 (*)    Total Bilirubin 1.3 (*)    GFR calc non Af Amer 51 (*)    GFR calc Af Amer 59 (*)    All other components within normal limits  CBC WITH DIFFERENTIAL/PLATELET - Abnormal; Notable for the following components:   WBC 13.1 (*)    Neutro Abs 10.1 (*)    All other components within normal limits  LIPASE, BLOOD    EKG  EKG Interpretation None       Radiology Ct Soft Tissue Neck W Contrast  Result Date: 11/04/2017 CLINICAL DATA:  Solitary neck mass EXAM: CT NECK WITH CONTRAST TECHNIQUE: Multidetector CT imaging of the neck was performed using the standard protocol following the bolus administration of intravenous contrast. CONTRAST:  170mL ISOVUE-300 IOPAMIDOL (ISOVUE-300) INJECTION 61% COMPARISON:  None. FINDINGS: Pharynx and larynx: No evidence of mass or swelling. Salivary glands: No inflammation, mass, or stone. Thyroid: Normal. Lymph nodes: None enlarged or abnormal density. Vascular: Atherosclerotic calcification of the carotid bifurcations and left subclavian origin. Limited intracranial: Negative Visualized orbits: Negative Mastoids and visualized paranasal sinuses: Clear Skeleton: Generalized cervical disc degenerative narrowing with notable posterior spurring at C4-5, encroaching on the ventral cord. Dental caries. No acute or aggressive finding. Upper chest: Negative Other: Patient's posterior neck mass correlates with a 25 x 8 mm lipoma along the  deep fascia layer. The lipoma demonstrates a few benign-appearing septations. There is symmetric suboccipital scalp fat reticulation that has a chronic scar-like appearance. No muscular expansion or edema. Negative for abscess. IMPRESSION: 1. 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 2. No acute finding in the neck. 3. Carotid atherosclerosis. Electronically Signed   By: Monte Fantasia M.D.   On: 11/04/2017 14:48   Ct Abdomen Pelvis W Contrast  Result Date: 11/04/2017 CLINICAL DATA:  Per ED Note: Right sided abd pain for the past 4 days. Intermittent left leg numbness x 2 weeks. Per DR Pascal Lux scan Abd / pelvis @ 60sec and scan neck asap Iso 300 1107ml^100mL ISOVUE-300 IOPAMIDOL (ISOVUE-300) INJECTION 61% EXAM: CT ABDOMEN AND PELVIS WITH CONTRAST TECHNIQUE: Multidetector CT imaging of the abdomen and pelvis was performed using the standard protocol following bolus administration of intravenous contrast. CONTRAST:  157mL ISOVUE-300 IOPAMIDOL (ISOVUE-300) INJECTION 61% COMPARISON:  None. FINDINGS: Lower chest:  Mild basilar atelectasis. Hepatobiliary: No focal hepatic lesion. No biliary duct dilatation. Gallbladder is normal. Common bile duct is normal. Pancreas: Pancreas is normal. No ductal dilatation. No pancreatic inflammation. Spleen: Normal spleen Adrenals/urinary tract: Adrenal glands and kidneys are normal. The ureters and bladder normal. Stomach/Bowel: Stomach, small bowel and terminal ileum are normal. The appendix is thickened and hyperemic measuring 11 mm in diameter (image 58, series 3). Rather robust inflammation in the adjacent fat. Small amount fluid along the RIGHT pericolic gutter. Suspected appendicolith within the distal portion of the appendix (image 54, series 6). The appendix extends retrocecal and cephalad along the RIGHT pericolic gutter. (Image 54, series 6). Fairly robust inflammation surrounding the appendix. Vascular/Lymphatic: Colon rectosigmoid normal Reproductive:  Prostate normal Other: No free fluid. Musculoskeletal: No aggressive osseous lesion. IMPRESSION: 1. Acute appendicitis with robust inflammatory reaction in the RIGHT lower quadrant. No evidence of macro perforation. 2.  Appendix: Location: Retrocecal Diameter: 11 mm Appendicolith: Potential Mucosal hyper-enhancement: Present Extraluminal gas: Absent Periappendiceal collection: Minimal Findings conveyed toTAMMY Ruhan Borak on 11/04/2017  at15:01. Electronically Signed   By: Suzy Bouchard M.D.   On: 11/04/2017 15:01    Procedures Procedures (including critical care time)  Medications Ordered in ED Medications  ertapenem (INVANZ) 1 g in sodium chloride 0.9 % 50 mL IVPB (not administered)  morphine 4 MG/ML injection 4 mg (4 mg Intravenous Given 11/04/17 1142)  ondansetron (ZOFRAN) injection 4 mg (4 mg Intravenous Given 11/04/17 1142)  iopamidol (ISOVUE-300) 61 % injection 100 mL (100 mLs Intravenous Contrast Given 11/04/17 1334)     Initial Impression / Assessment and Plan / ED Course  I have reviewed the triage vital signs and the nursing notes.  Pertinent labs & imaging results that were available during my care of the patient were reviewed by me and considered in my medical decision making (see chart for details).    1501 CT abdomen and pelvis results discussed with radiologist   [place] Dr. Arnoldo Morale and discussed CT A/P findings.  Requested Invanz and EKG.  Will see pt in ED and notify OR team.    Final Clinical Impressions(s) / ED Diagnoses   Final diagnoses:  Acute appendicitis, uncomplicated  Lipoma of neck    ED Discharge Orders    None       Kem Parkinson, PA-C 11/04/17 Cle[place], [place], DO 11/06/17 [phone]

## 2017-11-04 NOTE — H&P (Signed)
Carl Garza is an 62 y.o. male.   Chief Complaint: Right lower quadrant abdominal pain HPI: Patient is a 62 year old black male who presents with a 5-7-day history of worsening lower abdominal pain.  He presented to the emergency room and a CT scan of the abdomen revealed acute appendicitis.  A significant inflammatory response is noted around the appendix.  It is retrocecal in nature.  Past Medical History:  Diagnosis Date  . Hypertension     History reviewed. No pertinent surgical history.  Family History  Problem Relation Age of Onset  . Asthma Father    Social History:  reports that he has been smoking cigarettes.  He has been smoking about 0.50 packs per day. he has never used smokeless tobacco. He reports that he drinks alcohol. He reports that he does not use drugs.  Allergies: No Known Allergies   (Not in a hospital admission)  Results for orders placed or performed during the hospital encounter of 11/04/17 (from the past 48 hour(s))  Comprehensive metabolic panel     Status: Abnormal   Collection Time: 11/04/17 11:41 AM  Result Value Ref Range   Sodium 137 135 - 145 mmol/L   Potassium 3.4 (L) 3.5 - 5.1 mmol/L   Chloride 101 101 - 111 mmol/L   CO2 23 22 - 32 mmol/L   Glucose, Bld 89 65 - 99 mg/dL   BUN 23 (H) 6 - 20 mg/dL   Creatinine, Ser 1.45 (H) 0.61 - 1.24 mg/dL   Calcium 9.4 8.9 - 10.3 mg/dL   Total Protein 8.1 6.5 - 8.1 g/dL   Albumin 4.2 3.5 - 5.0 g/dL   AST 19 15 - 41 U/L   ALT 27 17 - 63 U/L   Alkaline Phosphatase 80 38 - 126 U/L   Total Bilirubin 1.3 (H) 0.3 - 1.2 mg/dL   GFR calc non Af Amer 51 (L) >60 mL/min   GFR calc Af Amer 59 (L) >60 mL/min    Comment: (NOTE) The eGFR has been calculated using the CKD EPI equation. This calculation has not been validated in all clinical situations. eGFR's persistently <60 mL/min signify possible Chronic Kidney Disease.    Anion gap 13 5 - 15    Comment: Performed at [facility], [address]  [place], [place] [address]  Lipase, blood     Status: None   Collection Time: 11/04/17 11:41 AM  Result Value Ref Range   Lipase 30 11 - 51 U/L    Comment: Performed at [facility], [address] [place], [place] [address]  CBC with Differential     Status: Abnormal   Collection Time: 11/04/17 11:41 AM  Result Value Ref Range   WBC 13.1 (H) 4.0 - 10.5 K/uL   RBC 4.47 4.22 - 5.81 MIL/uL   Hemoglobin 13.5 13.0 - 17.0 g/dL   HCT 40.4 39.0 - 52.0 %   MCV 90.4 78.0 - 100.0 fL   MCH 30.2 26.0 - 34.0 pg   MCHC 33.4 30.0 - 36.0 g/dL   RDW 14.3 11.5 - 15.5 %   Platelets 296 150 - 400 K/uL   Neutrophils Relative % 78 %   Neutro Abs 10.1 (H) 1.7 - 7.7 K/uL   Lymphocytes Relative 16 %   Lymphs Abs 2.1 0.7 - 4.0 K/uL   Monocytes Relative 6 %   Monocytes Absolute 0.8 0.1 - 1.0 K/uL   Eosinophils Relative 0 %   Eosinophils Absolute 0.0 0.0 - 0.7 K/uL  Basophils Relative 0 %   Basophils Absolute 0.0 0.0 - 0.1 K/uL    Comment: Performed at [facility], [address], [place], [place] [address]   Ct Soft Tissue Neck W Contrast  Result Date: 11/04/2017 CLINICAL DATA:  Solitary neck mass EXAM: CT NECK WITH CONTRAST TECHNIQUE: Multidetector CT imaging of the neck was performed using the standard protocol following the bolus administration of intravenous contrast. CONTRAST:  123m ISOVUE-300 IOPAMIDOL (ISOVUE-300) INJECTION 61% COMPARISON:  None. FINDINGS: Pharynx and larynx: No evidence of mass or swelling. Salivary glands: No inflammation, mass, or stone. Thyroid: Normal. Lymph nodes: None enlarged or abnormal density. Vascular: Atherosclerotic calcification of the carotid bifurcations and left subclavian origin. Limited intracranial: Negative Visualized orbits: Negative Mastoids and visualized paranasal sinuses: Clear Skeleton: Generalized cervical disc degenerative narrowing with notable posterior spurring at C4-5, encroaching on the ventral cord. Dental caries. No acute or aggressive finding. 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 The lipoma demonstrates a few benign-appearing septations. There is symmetric suboccipital scalp fat reticulation that has a chronic scar-like appearance. No muscular expansion or edema. Negative for abscess. IMPRESSION: 1. 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 2. No acute finding in the neck. 3. Carotid atherosclerosis. Electronically Signed   By: JMonte FantasiaM.D.   On: 11/04/2017 14:48   Ct Abdomen Pelvis W Contrast  Result Date: 11/04/2017 CLINICAL DATA:  Per ED Note: Right sided abd pain for the past 4 days. Intermittent left leg numbness x 2 weeks. Per DR WPascal Luxscan Abd / pelvis @ 60sec and scan neck asap Iso 300 1016m100mL ISOVUE-300 IOPAMIDOL (ISOVUE-300) INJECTION 61% EXAM: CT ABDOMEN AND PELVIS WITH CONTRAST TECHNIQUE: Multidetector CT imaging of the abdomen and pelvis was performed using the standard protocol following bolus administration of intravenous contrast. CONTRAST:  10050mSOVUE-300 IOPAMIDOL (ISOVUE-300) INJECTION 61% COMPARISON:  None. FINDINGS: Lower chest: Mild basilar atelectasis. Hepatobiliary: No focal hepatic lesion. No biliary duct dilatation. Gallbladder is normal. Common bile duct is normal. Pancreas: Pancreas is normal. No ductal dilatation. No pancreatic inflammation. Spleen: Normal spleen Adrenals/urinary tract: Adrenal glands and kidneys are normal. The ureters and bladder normal. Stomach/Bowel: Stomach, small bowel and terminal ileum are normal. The appendix is thickened and hyperemic measuring 11 mm in diameter (image 58, series 3). Rather robust inflammation in the adjacent fat. Small amount fluid along the RIGHT pericolic gutter. Suspected appendicolith within the distal portion of the appendix (image 54, series 6). The appendix extends retrocecal and cephalad along the RIGHT pericolic gutter. (Image 54, series 6). Fairly robust  inflammation surrounding the appendix. Vascular/Lymphatic: Colon rectosigmoid normal Reproductive: Prostate normal Other: No free fluid. Musculoskeletal: No aggressive osseous lesion. IMPRESSION: 1. Acute appendicitis with robust inflammatory reaction in the RIGHT lower quadrant. No evidence of macro perforation. 2.  Appendix: Location: Retrocecal Diameter: 11 mm Appendicolith: Potential Mucosal hyper-enhancement: Present Extraluminal gas: Absent Periappendiceal collection: Minimal Findings conveyed toTAMMY TRIPLETT on 11/04/2017  at15:01. Electronically Signed   By: SteSuzy BouchardD.   On: 11/04/2017 15:01    Review of Systems  Constitutional: Positive for malaise/fatigue.  HENT: Negative.   Eyes: Negative.   Respiratory: Negative.   Cardiovascular: Negative.   Gastrointestinal: Negative.   Genitourinary: Negative.   Musculoskeletal: Negative.   Skin: Negative.   Neurological: Negative.   Endo/Heme/Allergies: Negative.   Psychiatric/Behavioral: Negative.     Blood pressure 117/85, pulse 96, temperature 98.1 F (36.7  C), temperature source Oral, resp. rate 18, height 5' 8"  (1.727 m), weight 204 lb (92.5 kg), SpO2 98 %. Physical Exam  Vitals reviewed. Constitutional: He is oriented to person, place, and time. He appears well-developed and well-nourished.  HENT:  Head: Normocephalic and atraumatic.  Cardiovascular: Normal rate, regular rhythm and normal heart sounds. Exam reveals no gallop and no friction rub.  No murmur heard. Respiratory: Effort normal and breath sounds normal. No respiratory distress. He has no wheezes. He has no rales.  GI: Soft. Bowel sounds are normal. He exhibits no distension. There is tenderness. There is no rebound.  Tender in the right lower quadrant to palpation.  No rigidity noted.  Neurological: He is alert and oriented to person, place, and time.  Skin: Skin is warm and dry.    CT scan images personally reviewed Assessment/Plan Impression: Acute  appendicitis Plan: Patient will be taken to the operating room for laparoscopic appendectomy.  The risks and benefits of the procedure including bleeding, infection, and the possibility of an open procedure were fully explained to the patient, who gave informed consent.  Aviva Signs, MD 11/04/2017, 4:21 PM

## 2017-11-04 NOTE — Anesthesia Preprocedure Evaluation (Addendum)
Anesthesia Evaluation  Patient identified by MRN, date of birth, ID band Patient awake    Reviewed: Allergy & Precautions, NPO status , Patient's Chart, lab work & pertinent test results  Airway Mallampati: II  TM Distance: >3 FB Neck ROM: Full    Dental  (+) Teeth Intact   Pulmonary Current Smoker,    breath sounds clear to auscultation       Cardiovascular Exercise Tolerance: Good hypertension, Pt. on medications  Rhythm:Regular Rate:Normal     Neuro/Psych    GI/Hepatic   Endo/Other    Renal/GU      Musculoskeletal   Abdominal (+)  Abdomen: soft. Bowel sounds: normal.  Peds  Hematology   Anesthesia Other Findings   Reproductive/Obstetrics                            Anesthesia Physical Anesthesia Plan  ASA: II and emergent  Anesthesia Plan: General   Post-op Pain Management:    Induction: Intravenous, Rapid sequence and Cricoid pressure planned  PONV Risk Score and Plan: Metaclopromide and Ondansetron  Airway Management Planned: Oral ETT  Additional Equipment:   Intra-op Plan:   Post-operative Plan: Extubation in OR  Informed Consent: I have reviewed the patients History and Physical, chart, labs and discussed the procedure including the risks, benefits and alternatives for the proposed anesthesia with the patient or authorized representative who has indicated his/her understanding and acceptance.   Dental advisory given  Plan Discussed with: CRNA, Anesthesiologist and Surgeon  Anesthesia Plan Comments:         Anesthesia Quick Evaluation

## 2017-11-04 NOTE — Op Note (Signed)
Patient:  Carl Garza  DOB:  05-14-1956  MRN:  [id number]   Preop Diagnosis: Acute appendicitis  Postop Diagnosis: Same  Procedure: Laparoscopic appendectomy  Surgeon: Aviva Signs, MD  Anes: General tracheal  Indications: Patient is a 62 year old black male who presented to the hospital with worsening right lower quadrant abdominal pain over the past 5-7 days.  CT scan of the abdomen revealed acute appendicitis.  The patient now comes to the operating room for laparoscopic appendectomy.  The risks and benefits of the procedure including bleeding, infection, and the possibility of an open procedure were fully explained to the patient, who gave informed consent.  Procedure note: The patient was placed in the supine position.  After induction of general endotracheal anesthesia, the abdomen was prepped and draped using the usual sterile technique with DuraPrep.  Surgical site confirmation was performed.  A supraumbilical incision was made down to the fascia.  A Veress needle was introduced into the abdominal cavity and confirmation of placement was done using the saline drop test.  The abdomen was then insufflated to 16 mmHg pressure.  An 11 mm trocar was introduced into the abdominal cavity under direct visualization without difficulty.  The patient was placed in deeper Trendelenburg position and an additional 12 mm trocar was placed in the suprapubic region and a 5 mm trocar was placed in the left lower quadrant region.  The appendix was visualized and noted to be acutely inflamed.  There was no evidence of perforation.  The mesoappendix was divided using the harmonic scalpel.  The juncture between the cecum and appendix was fully visualized.  A standard Endo GIA was placed across the base of the appendix and fired.  The appendix was then removed using an Endo Catch bag without difficulty.  The staple line was inspected and was within normal limits.  No abnormal bleeding was noted at the end  of the procedure.  All fluid and air were then evacuated from the abdominal cavity prior to the removal of the trochars.  All wounds were irrigated with normal saline.  All wounds were injected with Exparel.  The supraumbilical fascia was reapproximated using an 0 Vicryl interrupted suture.  All skin incisions were closed using staples.  Betadine ointment and dry sterile dressings were applied.  All tape and needle counts were correct at the end of the procedure.  Patient was extubated in the operating room and transferred to PACU in stable condition.  Complications: None  EBL: Minimal  Specimen: Appendix

## 2017-11-04 NOTE — ED Triage Notes (Signed)
Pt reports having a knot on the back of his neck causing his head to hurt x 1 week.  Right sided abd pain for the past 4 days.  Intermittent left leg numbness x 2 weeks.

## 2017-11-04 NOTE — Progress Notes (Signed)
Transported from pacu at 1850.  Drowsy but awaked to name.  Three lap sites dry and intact. SCDs in place and on 2lL.  Vitals stable.  Just swallowed meds with no difficulty and has had some snacks.  Wife at bedside and to spend night.

## 2017-11-04 NOTE — Progress Notes (Signed)
Attempted to instruct pt on incentive spirometry. Pt is asleep with family at bedside. Asked family to call should pt wake up so we can start IS

## 2017-11-04 NOTE — Anesthesia Procedure Notes (Signed)
Procedure Name: Intubation Date/Time: 11/04/2017 5:05 PM Performed by: Charmaine Downs, CRNA Pre-anesthesia Checklist: Patient identified, Patient being monitored, Timeout performed, Emergency Drugs available and Suction available Patient Re-evaluated:Patient Re-evaluated prior to induction Oxygen Delivery Method: Circle System Utilized Preoxygenation: Pre-oxygenation with 100% oxygen Induction Type: IV induction, Cricoid Pressure applied and Rapid sequence Ventilation: Mask ventilation without difficulty Laryngoscope Size: Mac and 4 Grade View: Grade II Tube type: Oral Tube size: 8.0 mm Number of attempts: 1 Airway Equipment and Method: stylet Placement Confirmation: ETT inserted through vocal cords under direct vision,  positive ETCO2 and breath sounds checked- equal and bilateral Secured at: 22 cm Tube secured with: Tape Dental Injury: Teeth and Oropharynx as per pre-operative assessment

## 2017-11-04 NOTE — Transfer of Care (Signed)
Immediate Anesthesia Transfer of Care Note  Patient: Carl Garza  Procedure(s) Performed: APPENDECTOMY LAPAROSCOPIC (N/A )  Patient Location: PACU  Anesthesia Type:General  Level of Consciousness: drowsy  Airway & Oxygen Therapy: Patient Spontanous Breathing and Patient connected to face mask oxygen  Post-op Assessment: Report given to RN, Post -op Vital signs reviewed and stable and Patient moving all extremities  Post vital signs: Reviewed and stable  Last Vitals:  Vitals:   11/04/17 0859 11/04/17 1648  BP: 117/85 90/68  Pulse: 96 97  Resp: 18 18  Temp: 36.7 C 37 C  SpO2: 98% 96%    Last Pain:  Vitals:   11/04/17 1648  TempSrc: Oral  PainSc:          Complications: No apparent anesthesia complications

## 2017-11-05 LAB — BASIC METABOLIC PANEL
Anion gap: 11 (ref 5–15)
BUN: 20 mg/dL (ref 6–20)
CO2: 23 mmol/L (ref 22–32)
Calcium: 8.9 mg/dL (ref 8.9–10.3)
Chloride: 103 mmol/L (ref 101–111)
Creatinine, Ser: 1.42 mg/dL — ABNORMAL HIGH (ref 0.61–1.24)
GFR calc Af Amer: >60 mL/min (ref >60)
GFR calc non Af Amer: 53 mL/min — ABNORMAL LOW (ref >60)
GLUCOSE: 107 mg/dL — AB (ref 65–99)
POTASSIUM: 3.3 mmol/L — AB (ref 3.5–5.1)
Sodium: 137 mmol/L (ref 135–145)

## 2017-11-05 LAB — CBC
HEMATOCRIT: 37.5 % — AB (ref 39.0–52.0)
Hemoglobin: 12.2 g/dL — ABNORMAL LOW (ref 13.0–17.0)
MCH: 29.5 pg (ref 26.0–34.0)
MCHC: 32.5 g/dL (ref 30.0–36.0)
MCV: 90.8 fL (ref 78.0–100.0)
Platelets: 298 10*3/uL (ref 150–400)
RBC: 4.13 MIL/uL — ABNORMAL LOW (ref 4.22–5.81)
RDW: 14.4 % (ref 11.5–15.5)
WBC: 14.3 10*3/uL — ABNORMAL HIGH (ref 4.0–10.5)

## 2017-11-05 MED ORDER — MAGNESIUM HYDROXIDE 400 MG/5ML PO SUSP: 30.0000 mL | Freq: Three times a day (TID) | ORAL | Status: DC | PRN

## 2017-11-05 MED ORDER — KCL IN DEXTROSE-NACL 40-5-0.45 MEQ/L-%-% IV SOLN
INTRAVENOUS | Status: DC
  Administered 2017-11-05 – 2017-11-06 (×2): via INTRAVENOUS

## 2017-11-05 NOTE — Progress Notes (Signed)
1 Day Post-Op  Subjective: Patient having some difficulty voiding.  Moderate incisional pain.  No bowel movement or flatus yet.  Objective: Vital signs in last 24 hours: Temp:  [98.2 F (36.8 C)-99.3 F (37.4 C)] 98.5 F (36.9 C) (02/10 0509) Pulse Rate:  [97-107] 104 (02/10 0900) Resp:  [16-22] 18 (02/10 0900) BP: (90-141)/(67-98) 101/67 (02/10 1036) SpO2:  [93 %-98 %] 96 % (02/10 0900) Last BM Date: 11/03/17  Intake/Output from previous day: 02/09 0701 - 02/10 0700 In: 1555 [P.O.:120; I.V.:1435] Out: 255 [Urine:250; Blood:5] Intake/Output this shift: No intake/output data recorded.  General appearance: alert, cooperative and no distress Resp: clear to auscultation bilaterally Cardio: regular rate and rhythm, S1, S2 normal, no murmur, click, rub or gallop GI: Soft, mildly distended.  Dressing is dry and intact.  Occasional bowel sounds appreciated.  Lab Results:  Recent Labs    11/04/17 1141 11/05/17 0607  WBC 13.1* 14.3*  HGB 13.5 12.2*  HCT 40.4 37.5*  PLT 296 298   BMET Recent Labs    11/04/17 1141 11/05/17 0607  NA 137 137  K 3.4* 3.3*  CL 101 103  CO2 23 23  GLUCOSE 89 107*  BUN 23* 20  CREATININE 1.45* 1.42*  CALCIUM 9.4 8.9   PT/INR No results for input(s): LABPROT, INR in the last 72 hours.  Studies/Results: Ct Soft Tissue Neck W Contrast  Result Date: 11/04/2017 CLINICAL DATA:  Solitary neck mass EXAM: CT NECK WITH CONTRAST TECHNIQUE: Multidetector CT imaging of the neck was performed using the standard protocol following the bolus administration of intravenous contrast. CONTRAST:  139mL ISOVUE-300 IOPAMIDOL (ISOVUE-300) INJECTION 61% COMPARISON:  None. FINDINGS: Pharynx and larynx: No evidence of mass or swelling. Salivary glands: No inflammation, mass, or stone. Thyroid: Normal. Lymph nodes: None enlarged or abnormal density. Vascular: Atherosclerotic calcification of the carotid bifurcations and left subclavian origin. Limited intracranial:  Negative Visualized orbits: Negative Mastoids and visualized paranasal sinuses: Clear Skeleton: Generalized cervical disc degenerative narrowing with notable posterior spurring at C4-5, encroaching on the ventral cord. Dental caries. No acute or aggressive finding. 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 The lipoma demonstrates a few benign-appearing septations. There is symmetric suboccipital scalp fat reticulation that has a chronic scar-like appearance. No muscular expansion or edema. Negative for abscess. IMPRESSION: 1. 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 2. No acute finding in the neck. 3. Carotid atherosclerosis. Electronically Signed   By: Monte Fantasia M.D.   On: 11/04/2017 14:48   Ct Abdomen Pelvis W Contrast  Result Date: 11/04/2017 CLINICAL DATA:  Per ED Note: Right sided abd pain for the past 4 days. Intermittent left leg numbness x 2 weeks. Per DR Pascal Lux scan Abd / pelvis @ 60sec and scan neck asap Iso 300 176ml^100mL ISOVUE-300 IOPAMIDOL (ISOVUE-300) INJECTION 61% EXAM: CT ABDOMEN AND PELVIS WITH CONTRAST TECHNIQUE: Multidetector CT imaging of the abdomen and pelvis was performed using the standard protocol following bolus administration of intravenous contrast. CONTRAST:  18mL ISOVUE-300 IOPAMIDOL (ISOVUE-300) INJECTION 61% COMPARISON:  None. FINDINGS: Lower chest: Mild basilar atelectasis. Hepatobiliary: No focal hepatic lesion. No biliary duct dilatation. Gallbladder is normal. Common bile duct is normal. Pancreas: Pancreas is normal. No ductal dilatation. No pancreatic inflammation. Spleen: Normal spleen Adrenals/urinary tract: Adrenal glands and kidneys are normal. The ureters and bladder normal. Stomach/Bowel: Stomach, small bowel and terminal ileum are normal. The appendix is thickened and hyperemic  measuring 11 mm in diameter (image 58, series 3). Rather robust inflammation in the  adjacent fat. Small amount fluid along the RIGHT pericolic gutter. Suspected appendicolith within the distal portion of the appendix (image 54, series 6). The appendix extends retrocecal and cephalad along the RIGHT pericolic gutter. (Image 54, series 6). Fairly robust inflammation surrounding the appendix. Vascular/Lymphatic: Colon rectosigmoid normal Reproductive: Prostate normal Other: No free fluid. Musculoskeletal: No aggressive osseous lesion. IMPRESSION: 1. Acute appendicitis with robust inflammatory reaction in the RIGHT lower quadrant. No evidence of macro perforation. 2.  Appendix: Location: Retrocecal Diameter: 11 mm Appendicolith: Potential Mucosal hyper-enhancement: Present Extraluminal gas: Absent Periappendiceal collection: Minimal Findings conveyed toTAMMY TRIPLETT on 11/04/2017  at15:01. Electronically Signed   By: Suzy Bouchard M.D.   On: 11/04/2017 15:01    Anti-infectives: Anti-infectives (From admission, onward)   Start     Dose/Rate Route Frequency Ordered Stop   11/05/17 1600  ertapenem (INVANZ) 1 g in sodium chloride 0.9 % 50 mL IVPB     1 g 100 mL/hr over 30 Minutes Intravenous Every 24 hours 11/04/17 1903     11/04/17 1515  ertapenem (INVANZ) 1 g in sodium chloride 0.9 % 50 mL IVPB     1 g 100 mL/hr over 30 Minutes Intravenous  Once 11/04/17 1515 11/04/17 1617      Assessment/Plan: s/p Procedure(s): APPENDECTOMY LAPAROSCOPIC Impression: Stable on postoperative day 1.  Patient is having some difficulties with voiding.  We will continue hydrating and encourage ambulation.  Will give milk of magnesia for constipation.  Anticipate discharge in next 24-48 hours.  LOS: 0 days    Aviva Signs 11/05/2017

## 2017-11-05 NOTE — Anesthesia Postprocedure Evaluation (Signed)
Anesthesia Post Note  Patient: TORRANCE STOCKLEY  Procedure(s) Performed: APPENDECTOMY LAPAROSCOPIC (N/A )  Patient location during evaluation: Nursing Unit Anesthesia Type: General Level of consciousness: awake, sedated and patient cooperative Pain management: pain level controlled Vital Signs Assessment: post-procedure vital signs reviewed and stable Respiratory status: spontaneous breathing, nonlabored ventilation and respiratory function stable Cardiovascular status: blood pressure returned to baseline and stable Postop Assessment: no apparent nausea or vomiting and adequate PO intake Anesthetic complications: no     Last Vitals:  Vitals:   11/05/17 0900 11/05/17 1036  BP: (!) 137/93 101/67  Pulse: (!) 104   Resp: 18   Temp:    SpO2: 96%     Last Pain:  Vitals:   11/05/17 1309  TempSrc:   PainSc: 7                  Aja Bolander J

## 2017-11-05 NOTE — Progress Notes (Signed)
Informed by Pharmacist Larence Penning) that pt has exceeded the recommended dose for tylenol so all doses for the shift will not be given and will be held.

## 2017-11-06 LAB — BASIC METABOLIC PANEL
Anion gap: 10 (ref 5–15)
BUN: 15 mg/dL (ref 6–20)
CHLORIDE: 101 mmol/L (ref 101–111)
CO2: 25 mmol/L (ref 22–32)
CREATININE: 1.34 mg/dL — AB (ref 0.61–1.24)
Calcium: 8.6 mg/dL — ABNORMAL LOW (ref 8.9–10.3)
GFR calc Af Amer: >60 mL/min (ref >60)
GFR calc non Af Amer: 56 mL/min — ABNORMAL LOW (ref >60)
Glucose, Bld: 112 mg/dL — ABNORMAL HIGH (ref 65–99)
POTASSIUM: 3.3 mmol/L — AB (ref 3.5–5.1)
Sodium: 136 mmol/L (ref 135–145)

## 2017-11-06 LAB — CBC
HEMATOCRIT: 34.1 % — AB (ref 39.0–52.0)
HEMOGLOBIN: 11 g/dL — AB (ref 13.0–17.0)
MCH: 29.6 pg (ref 26.0–34.0)
MCHC: 32.3 g/dL (ref 30.0–36.0)
MCV: 91.7 fL (ref 78.0–100.0)
Platelets: 282 10*3/uL (ref 150–400)
RBC: 3.72 MIL/uL — AB (ref 4.22–5.81)
RDW: 14.5 % (ref 11.5–15.5)
WBC: 11 10*3/uL — ABNORMAL HIGH (ref 4.0–10.5)

## 2017-11-06 MED ORDER — SODIUM CHLORIDE 0.9 % IV SOLN
1.0000 g | INTRAVENOUS | Status: DC
  Filled 2017-11-06: qty 1

## 2017-11-06 MED ORDER — OXYCODONE-ACETAMINOPHEN 7.5-325 MG PO TABS: 1.0000 | ORAL_TABLET | Freq: Four times a day (QID) | ORAL | 0 refills | Status: DC | PRN

## 2017-11-06 NOTE — Progress Notes (Signed)
Patient is to be discharged home and in stable condition. Patient's IV removed, WNL. Patient given discharge instructions and verbalized understanding. All questions addressed and answered. Patient denies the need for a wheelchair escort out.  Celestia Khat, RN

## 2017-11-06 NOTE — Progress Notes (Signed)
Instructed pt and wife on IS use. Pt encouraged to call with questions if needed

## 2017-11-06 NOTE — Discharge Summary (Signed)
Physician Discharge Summary  Patient ID: Carl Garza MRN: [id number] DOB/AGE: 60-Nov-1957 60 y.o.  Admit date: 11/04/2017 Discharge date: 11/06/2017  Admission Diagnoses: Acute appendicitis  Discharge Diagnoses: Same Active Problems:   Acute appendicitis, uncomplicated   Acute appendicitis   Discharged Condition: good  Hospital Course: Patient is a 62 year old black male who presented to the emergency room with worsening right lower quadrant abdominal pain.  He was found on CT scan of the abdomen to have acute appendicitis.  He was taken to the operating room on 11/04/2017 and underwent laparoscopic appendectomy.  Tolerated procedure well.  His postoperative course was remarkable for some moderate incisional pain as well as difficulty voiding.  His diet was advanced without difficulty.  His voiding issues did resolve.  The patient is being discharged home on postoperative day 2 in good and improving condition.  Treatments: surgery: Laparoscopic appendectomy on 11/04/2017  Discharge Exam: Blood pressure 116/65, pulse 97, temperature 98.8 F (37.1 C), temperature source Oral, resp. rate 18, height 5\' 8"  (1.727 m), weight 204 lb (92.5 kg), SpO2 97 %. General appearance: alert, cooperative and no distress Resp: clear to auscultation bilaterally Cardio: regular rate and rhythm, S1, S2 normal, no murmur, click, rub or gallop GI: Soft, incisions healing well.  Disposition: 01-Home or Self Care  Discharge Instructions    Diet - low sodium heart healthy   Complete by:  As directed    Increase activity slowly   Complete by:  As directed      Allergies as of 11/06/2017   No Known Allergies     Medication List    TAKE these medications   amLODipine 10 MG tablet Commonly known as:  NORVASC Take 10 mg by mouth daily.   ezetimibe 10 MG tablet Commonly known as:  ZETIA Take 10 mg by mouth daily.   lisinopril 10 MG tablet Commonly known as:  PRINIVIL Take 1 tablet (10 mg total) by  mouth daily.   oxyCODONE-acetaminophen 7.5-325 MG tablet Commonly known as:  PERCOCET Take 1-2 tablets by mouth every 6 (six) hours as needed.   valsartan-hydrochlorothiazide 160-25 MG tablet Commonly known as:  DIOVAN-HCT Take 1 tablet by mouth daily.      Follow-up Information    Aviva Signs, MD. Schedule an appointment as soon as possible for a visit on 11/14/2017.   Specialty:  General Surgery Contact information: [id number] [address] [phone]           Signed: Aviva Signs 11/06/2017, 10:34 AM

## 2017-11-06 NOTE — Discharge Instructions (Signed)
Laparoscopic Appendectomy, Adult, Care After °Refer to this sheet in the next few weeks. These instructions provide you with information about caring for yourself after your procedure. Your health care provider may also give you more specific instructions. Your treatment has been planned according to current medical practices, but problems sometimes occur. Call your health care provider if you have any problems or questions after your procedure. °What can I expect after the procedure? °After the procedure, it is common to have: °· A decrease in your energy level. °· Mild pain in the area where the surgical cuts (incisions) were made. °· Constipation. This can be caused by pain medicine and a decrease in your activity. ° °Follow these instructions at home: °Medicines °· Take over-the-counter and prescription medicines only as told by your health care provider. °· Do not drive for 24 hours if you received a sedative. °· Do not drive or operate heavy machinery while taking prescription pain medicine. °· If you were prescribed an antibiotic medicine, take it as told by your health care provider. Do not stop taking the antibiotic even if you start to feel better. °Activity °· For 3 weeks or as long as told by your health care provider: °? Do not lift anything that is heavier than 10 pounds (4.5 kg). °? Do not play contact sports. °· Gradually return to your normal activities. Ask your health care provider what activities are safe for you. °Bathing °· Keep your incisions clean and dry. Clean them as often as told by your health care provider: °? Gently wash the incisions with soap and water. °? Rinse the incisions with water to remove all soap. °? Pat the incisions dry with a clean towel. Do not rub the incisions. °· You may take showers after 48 hours. °· Do not take baths, swim, or use hot tubs for 2 weeks or as told by your health care provider. °Incision care °· Follow instructions from your healthcare provider about  how to take care of your incisions. Make sure you: °? Wash your hands with soap and water before you change your bandage (dressing). If soap and water are not available, use hand sanitizer. °? Change your dressing as told by your health care provider. °? Leave stitches (sutures), skin glue, or adhesive strips in place. These skin closures may need to stay in place for 2 weeks or longer. If adhesive strip edges start to loosen and curl up, you may trim the loose edges. Do not remove adhesive strips completely unless your health care provider tells you to do that. °· Check your incision areas every day for signs of infection. Check for: °? More redness, swelling, or pain. °? More fluid or blood. °? Warmth. °? Pus or a bad smell. °Other Instructions °· If you were sent home with a drain, follow instructions from your health care provider about how to care for the drain and how to empty it. °· Take deep breaths. This helps to prevent your lungs from becoming inflamed. °· To relieve and prevent constipation: °? Drink plenty of fluids. °? Eat plenty of fruits and vegetables. °· Keep all follow-up visits as told by your health care provider. This is important. °Contact a health care provider if: °· You have more redness, swelling, or pain around an incision. °· You have more fluid or blood coming from an incision. °· Your incision feels warm to the touch. °· You have pus or a bad smell coming from an incision or dressing. °· Your incision   edges break open after your sutures have been removed. °· You have increasing pain in your shoulders. °· You feel dizzy or you faint. °· You develop shortness of breath. °· You keep feeling nauseous or vomiting. °· You have diarrhea or you cannot control your bowel functions. °· You lose your appetite. °· You develop swelling or pain in your legs. °Get help right away if: °· You have a fever. °· You develop a rash. °· You have difficulty breathing. °· You have sharp pains in your  chest. °This information is not intended to replace advice given to you by your health care provider. Make sure you discuss any questions you have with your health care provider. °Document Released: 09/12/2005 Document Revised: 02/12/2016 Document Reviewed: 03/02/2015 °Elsevier Interactive Patient Education © 2018 Elsevier Inc. ° °

## 2017-11-07 ENCOUNTER — Encounter (HOSPITAL_COMMUNITY): Payer: Self-pay | Admitting: General Surgery

## 2017-11-14 ENCOUNTER — Ambulatory Visit (INDEPENDENT_AMBULATORY_CARE_PROVIDER_SITE_OTHER): Payer: Self-pay | Admitting: General Surgery

## 2017-11-14 ENCOUNTER — Encounter: Payer: Self-pay | Admitting: General Surgery

## 2017-11-14 VITALS — BP 140/90 | HR 90 | Temp 95.7°F | Ht 68.0 in | Wt 202.0 lb

## 2017-11-14 DIAGNOSIS — Z09 Encounter for follow-up examination after completed treatment for conditions other than malignant neoplasm: Secondary | ICD-10-CM

## 2017-11-14 NOTE — Progress Notes (Signed)
Subjective:     Carl Garza  Status post laparoscopic appendectomy.  Doing well.  Has no complaints. Objective:    BP 140/90   Pulse 90   Temp (!) 95.7 F (35.4 C)   Ht 5\' 8"  (1.727 m)   Wt 202 lb (91.6 kg)   BMI 30.71 kg/m   General:  alert, cooperative and no distress  Abdomen soft, incisions healing well.  Staples removed. Final pathology consistent with diagnosis.     Assessment:    Doing well postoperatively.    Plan:   Gradually resume normal activity.  Follow-up here as needed.

## 2018-01-10 ENCOUNTER — Ambulatory Visit (INDEPENDENT_AMBULATORY_CARE_PROVIDER_SITE_OTHER): Payer: 59

## 2018-01-10 ENCOUNTER — Encounter: Payer: Self-pay | Admitting: Orthopedic Surgery

## 2018-01-10 ENCOUNTER — Ambulatory Visit: Payer: 59 | Admitting: Orthopedic Surgery

## 2018-01-10 VITALS — BP 117/79 | HR 94 | Ht 68.0 in | Wt 203.0 lb

## 2018-01-10 DIAGNOSIS — M5442 Lumbago with sciatica, left side: Secondary | ICD-10-CM

## 2018-01-10 DIAGNOSIS — M5440 Lumbago with sciatica, unspecified side: Secondary | ICD-10-CM | POA: Diagnosis not present

## 2018-01-10 DIAGNOSIS — G8929 Other chronic pain: Secondary | ICD-10-CM

## 2018-01-10 MED ORDER — GABAPENTIN 300 MG PO CAPS: 300.0000 mg | ORAL_CAPSULE | Freq: Three times a day (TID) | ORAL | 5 refills | Status: DC

## 2018-01-10 MED ORDER — CYCLOBENZAPRINE HCL 10 MG PO TABS: 10.0000 mg | ORAL_TABLET | Freq: Two times a day (BID) | ORAL | 1 refills | Status: DC | PRN

## 2018-01-10 MED ORDER — PREDNISONE 10 MG (48) PO TBPK: ORAL_TABLET | Freq: Every day | ORAL | 0 refills | Status: DC

## 2018-01-10 NOTE — Progress Notes (Signed)
NEW PATIENT OFFICE VISIT   Chief Complaint  Patient presents with  . Back Pain    lumbar pain down left leg/ leg gives out     62 year old male presents for evaluation of lower back and left leg pain which she has had now for 3 months.  He did have some back pain last year it did not bother him enough to seek medical attention but he comes in today after referral from primary care doctor complaining of left lower back pain radiating down his left leg with global numbness in the left lower extremity weakness of the left leg frequent falls and frequent episodes of the left leg giving way.  He has not had any medical treatment.   Review of Systems  Constitutional: Negative for chills, fever, malaise/fatigue and weight loss.  Respiratory: Negative for shortness of breath.   Cardiovascular: Negative for chest pain.  Musculoskeletal: Positive for back pain.  Skin:       Mass posterior cervical spine consistent with sebaceous cyst  Neurological: Positive for tingling, sensory change, focal weakness and weakness.     Past Medical History:  Diagnosis Date  . Hypertension     Past Surgical History:  Procedure Laterality Date  . LAPAROSCOPIC APPENDECTOMY N/A 11/04/2017   Procedure: APPENDECTOMY LAPAROSCOPIC;  Surgeon: Aviva Signs, MD;  Location: [facility];  Service: General;  Laterality: N/A;    Family History  Problem Relation Age of Onset  . Asthma Father    Social History   Tobacco Use  . Smoking status: Current Every Day Smoker    Packs/day: 0.50    Types: Cigarettes  . Smokeless tobacco: Never Used  Substance Use Topics  . Alcohol use: Yes    Comment: on football days  . Drug use: No    Current Meds  Medication Sig  . amLODipine (NORVASC) 10 MG tablet Take 10 mg by mouth daily.  Marland Kitchen ezetimibe (ZETIA) 10 MG tablet Take 10 mg by mouth daily.  . valsartan-hydrochlorothiazide (DIOVAN-HCT) 160-25 MG tablet Take 1 tablet by mouth daily.    BP 117/79   Pulse 94   Ht 5\' 8"   (1.727 m)   Wt 203 lb (92.1 kg)   BMI 30.87 kg/m   Physical Exam  Constitutional: He is oriented to person, place, and time. He appears well-developed and well-nourished.  Vital signs have been reviewed and are stable. Gen. appearance the patient is well-developed and well-nourished with normal grooming and hygiene.   Neurological: He is alert and oriented to person, place, and time.  Skin: Skin is warm and dry. No erythema.  Psychiatric: He has a normal mood and affect.  Vitals reviewed.   Ortho Exam  Back exam Cervical spine nontender but there is a nontender mass consistent with sebaceous cyst approximately 2-1/2 x 3 cm subcutaneous location firm to touch nontender  Thoracic spine nontender no skin lesions  Lumbar spine tender at L5-S1 interspace L4-5 interspace and then left SI joint left gluteal area  Painful flexion spine decreased flexion and extension increased muscle tension left side normal right side skin normal  Limb alignment is normal bilaterally hip flexion extension normal in each leg both hips are stable hip flexion strength is normal bilaterally.  He does have weakness in plantar flexion dorsiflexion on the left foot normal on the right foot skin normal both legs normal temperature and pulse without edema both lower extremities.  Sensory exam is normal to soft touch and sharp touch in both legs reflexes are 2+  at the knee 1+ at the ankle toes are downgoing  The right lower extremity straight leg raise test is normal the left is positive at 45 degrees for radicular pain in the left lower extremity MEDICAL DECISION SECTION  xrays ordered?  Yes  My independent reading of xrays: See dictated report.  The report that is dictated by me reads spondylosis L1-2 through S1 moderate   Encounter Diagnosis  Name Primary?  . Chronic left-sided low back pain with left-sided sciatica Yes     PLAN:   Meds ordered this encounter  Medications  . predniSONE (STERAPRED  UNI-PAK 48 TAB) 10 MG (48) TBPK tablet    Sig: Take by mouth daily. 10mg  ds 12 day    Dispense:  48 tablet    Refill:  0  . gabapentin (NEURONTIN) 300 MG capsule    Sig: Take 1 capsule (300 mg total) by mouth 3 (three) times daily.    Dispense:  90 capsule    Refill:  5  . cyclobenzaprine (FLEXERIL) 10 MG tablet    Sig: Take 1 tablet (10 mg total) by mouth 2 (two) times daily as needed for muscle spasms.    Dispense:  40 tablet    Refill:  1   Injection? n MRI/CT/?  MRI lumbar spine  Patient to take medications as prescribed.  We will get an MRI to evaluate his left L4-5 and S1 disc space for possible disc herniation.  He should not do any work.

## 2018-01-10 NOTE — Patient Instructions (Signed)

## 2018-01-16 ENCOUNTER — Telehealth: Payer: Self-pay | Admitting: Orthopedic Surgery

## 2018-01-16 NOTE — Telephone Encounter (Signed)
Patient called asking if Dr. Aline Brochure had scheduled his MRI. I didn't see anything scheduled, and told the patient I would get a message to Amy and she if she/Dr. Aline Brochure can get it scheduled. He would like to go to [facility] for the MRI.  Please call and advise

## 2018-01-17 NOTE — Telephone Encounter (Signed)
He has Carl Garza can not go to [facility], I will work on this on Thursday  FYI only

## 2018-01-18 NOTE — Telephone Encounter (Signed)
I called [employer] to obtain a prior authorization for the MRI scan spoke to [place] and no prior Josem Kaufmann is required Ref # for the call is [id number]   I called patient to let him know he can call [facility] to schedule, since this is the preferred imaging location for his insurance  Left message.

## 2018-01-23 ENCOUNTER — Encounter: Payer: Self-pay | Admitting: Orthopedic Surgery

## 2018-01-24 ENCOUNTER — Telehealth: Payer: Self-pay | Admitting: Radiology

## 2018-01-24 NOTE — Telephone Encounter (Signed)
Called patient to make follow up appt to review MRI scan

## 2018-02-05 ENCOUNTER — Ambulatory Visit: Payer: 59 | Admitting: Orthopedic Surgery

## 2018-02-05 ENCOUNTER — Encounter: Payer: Self-pay | Admitting: Orthopedic Surgery

## 2018-02-05 VITALS — BP 131/101 | HR 102 | Ht 68.0 in | Wt 203.0 lb

## 2018-02-05 DIAGNOSIS — G8929 Other chronic pain: Secondary | ICD-10-CM

## 2018-02-05 DIAGNOSIS — M5442 Lumbago with sciatica, left side: Secondary | ICD-10-CM

## 2018-02-05 NOTE — Progress Notes (Signed)
FOLLOW UP VISIT : MRI RESULTS   Chief Complaint  Patient presents with  . Back Pain    better but still has pain  . Results    review MRI scan lumbar spine/ [facility]      HPI: The patient is here TO DISCUSS THE RESULTS OF MRI  62 year old male presents for evaluation of lower back and left leg pain which she has had now for 3 months.  He did have some back pain last year it did not bother him enough to seek medical attention but he comes in today after referral from primary care doctor complaining of left lower back pain radiating down his left leg with global numbness in the left lower extremity weakness of the left leg frequent falls and frequent episodes of the left leg giving way.  He has not had any medical treatment.  As noted above patient says he has made some progress.  He had an MRI which showed he has degenerative disc disease throughout his spine down to L4-L5.  He says he cannot do anything such as mow the grass stand up for any amount of time and his leg gives way frequently   Review of Systems  Gastrointestinal: Negative.   Genitourinary: Negative.   Neurological: Positive for weakness.     BP (!) 131/101   Pulse (!) 102   Ht 5\' 8"  (1.727 m)   Wt 203 lb (92.1 kg)   BMI 30.87 kg/m     Medical decision-making section   DATA  MRI REPORT: Report is from no [facility] so I will read it into the medical record.  Starting at L1-2 mild degenerative disc disease no focal disc herniation or spinal stenosis  L2-3 mild degenerative disc disease facet arthrosis mild disc bulge and marginal spurring no focal disc herniation  L3-4 mild degenerative disc disease facet arthrosis mild disc bulge marginal spurring  L4-5 mild degenerative disc disease facet arthrosis mild disc bulge no significant foraminal stenosis   Encounter Diagnosis  Name Primary?  . Chronic left-sided low back pain with left-sided sciatica Yes    PLAN: Recommend epidural  injection  Follow-up in 2 months

## 2018-02-05 NOTE — Patient Instructions (Addendum)
Call to schedule injections with [facility] the number is [phone]     Epidural Steroid Injection An epidural steroid injection is a shot of steroid medicine and numbing medicine that is given into the space between the spinal cord and the bones in your back (epidural space). The shot helps relieve pain caused by an irritated or swollen nerve root. The amount of pain relief you get from the injection depends on what is causing the nerve to be swollen and irritated, and how long your pain lasts. You are more likely to benefit from this injection if your pain is strong and comes on suddenly rather than if you have had pain for a long time. Tell a health care provider about:  Any allergies you have.  All medicines you are taking, including vitamins, herbs, eye drops, creams, and over-the-counter medicines.  Any problems you or family members have had with anesthetic medicines.  Any blood disorders you have.  Any surgeries you have had.  Any medical conditions you have.  Whether you are pregnant or may be pregnant. What are the risks? Generally, this is a safe procedure. However, problems may occur, including:  Headache.  Bleeding.  Infection.  Allergic reaction to medicines.  Damage to your nerves.  What happens before the procedure? Staying hydrated Follow instructions from your health care provider about hydration, which may include:  Up to 2 hours before the procedure - you may continue to drink clear liquids, such as water, clear fruit juice, black coffee, and plain tea.  Eating and drinking restrictions Follow instructions from your health care provider about eating and drinking, which may include:  8 hours before the procedure - stop eating heavy meals or foods such as meat, fried foods, or fatty foods.  6 hours before the procedure - stop eating light meals or foods, such as toast or cereal.  6 hours before the procedure - stop drinking milk or drinks  that contain milk.  2 hours before the procedure - stop drinking clear liquids.  Medicine  You may be given medicines to lower anxiety.  Ask your health care provider about: ? Changing or stopping your regular medicines. This is especially important if you are taking diabetes medicines or blood thinners. ? Taking medicines such as aspirin and ibuprofen. These medicines can thin your blood. Do not take these medicines before your procedure if your health care provider instructs you not to. General instructions  Plan to have someone take you home from the hospital or clinic. What happens during the procedure?  You may receive a medicine to help you relax (sedative).  You will be asked to lie on your abdomen.  The injection site will be cleaned.  A numbing medicine (local anesthetic) will be used to numb the injection site.  A needle will be inserted through your skin into the epidural space. You may feel some discomfort when this happens. An X-ray machine will be used to make sure the needle is put as close as possible to the affected nerve.  A steroid medicine and a local anesthetic will be injected into the epidural space.  The needle will be removed.  A bandage (dressing) will be put over the injection site. What happens after the procedure?  Your blood pressure, heart rate, breathing rate, and blood oxygen level will be monitored until the medicines you were given have worn off.  Your arm or leg may feel weak or numb for a few hours.  The injection  site may feel sore.  Do not drive for 24 hours if you received a sedative. This information is not intended to replace advice given to you by your health care provider. Make sure you discuss any questions you have with your health care provider. Document Released: 12/20/2007 Document Revised: 02/24/2016 Document Reviewed: 12/29/2015 Elsevier Interactive Patient Education  [employer].

## 2018-02-07 ENCOUNTER — Other Ambulatory Visit: Payer: Self-pay | Admitting: Orthopedic Surgery

## 2018-02-07 DIAGNOSIS — M5442 Lumbago with sciatica, left side: Principal | ICD-10-CM

## 2018-02-07 DIAGNOSIS — G8929 Other chronic pain: Secondary | ICD-10-CM

## 2018-02-13 ENCOUNTER — Ambulatory Visit
Admission: RE | Admit: 2018-02-13 | Discharge: 2018-02-13 | Disposition: A | Discharge status: Home / Self Care | Payer: 59 | Source: Ambulatory Visit | Attending: Orthopedic Surgery | Admitting: Orthopedic Surgery

## 2018-02-13 DIAGNOSIS — G8929 Other chronic pain: Secondary | ICD-10-CM

## 2018-02-13 DIAGNOSIS — M5442 Lumbago with sciatica, left side: Principal | ICD-10-CM

## 2018-02-13 MED ORDER — METHYLPREDNISOLONE ACETATE 40 MG/ML INJ SUSP (RADIOLOG
120.0000 mg | Freq: Once | INTRAMUSCULAR | Status: AC
  Administered 2018-02-13: 120 mg via EPIDURAL

## 2018-02-13 MED ORDER — IOPAMIDOL (ISOVUE-M 200) INJECTION 41%
1.0000 mL | Freq: Once | INTRAMUSCULAR | Status: AC
  Administered 2018-02-13: 1 mL via EPIDURAL

## 2018-02-13 NOTE — Discharge Instructions (Signed)

## 2018-04-20 ENCOUNTER — Encounter: Payer: Self-pay | Admitting: Internal Medicine

## 2018-05-09 ENCOUNTER — Ambulatory Visit: Payer: 59 | Admitting: Orthopedic Surgery

## 2018-05-16 ENCOUNTER — Other Ambulatory Visit: Payer: Self-pay | Admitting: Orthopedic Surgery

## 2018-05-16 ENCOUNTER — Ambulatory Visit: Payer: 59 | Admitting: Orthopedic Surgery

## 2018-05-16 VITALS — BP 118/86 | HR 98 | Ht 68.0 in | Wt 210.0 lb

## 2018-05-16 DIAGNOSIS — G8929 Other chronic pain: Secondary | ICD-10-CM

## 2018-05-16 DIAGNOSIS — M5442 Lumbago with sciatica, left side: Secondary | ICD-10-CM

## 2018-05-16 NOTE — Patient Instructions (Addendum)
Call Joy for your next back injection  / [phone]  Follow up with [place] after your last injection you can have 2 more.

## 2018-05-16 NOTE — Progress Notes (Signed)
Progress Note   Patient ID: Carl Garza, male   DOB: 1956-04-15, 62 y.o.   MRN: [id number]   Chief Complaint  Patient presents with  . Follow-up    Recheck on back pain after ESI, 02-13-18.    HPI 62 year old male status post 1 ESI with good result for 2 weeks and then is leg pain returned.  He is here for follow-up visit for more instructions and recommendations  ROS Bowel bladder function normal  No Known Allergies   BP 118/86   Pulse 98   Ht 5\' 8"  (1.727 m)   Wt 210 lb (95.3 kg)   BMI 31.93 kg/m   Physical Exam   Medical decisions:   I discussed options with him which include continued epidural injections versus surgery No diagnosis found.  PLAN:   He wants to repeat the epidural injections I will see him after the second injection has been completed    Arther Abbott, MD 05/16/2018 9:57 AM

## 2018-05-29 ENCOUNTER — Ambulatory Visit
Admission: RE | Admit: 2018-05-29 | Discharge: 2018-05-29 | Disposition: A | Discharge status: Home / Self Care | Payer: 59 | Source: Ambulatory Visit | Attending: Orthopedic Surgery | Admitting: Orthopedic Surgery

## 2018-05-29 DIAGNOSIS — G8929 Other chronic pain: Secondary | ICD-10-CM

## 2018-05-29 DIAGNOSIS — M5442 Lumbago with sciatica, left side: Principal | ICD-10-CM

## 2018-05-29 MED ORDER — METHYLPREDNISOLONE ACETATE 40 MG/ML INJ SUSP (RADIOLOG
120.0000 mg | Freq: Once | INTRAMUSCULAR | Status: AC
  Administered 2018-05-29: 120 mg via EPIDURAL

## 2018-05-29 MED ORDER — IOPAMIDOL (ISOVUE-M 200) INJECTION 41%
1.0000 mL | Freq: Once | INTRAMUSCULAR | Status: AC
  Administered 2018-05-29: 1 mL via EPIDURAL

## 2018-05-29 NOTE — Discharge Instructions (Signed)

## 2018-05-30 ENCOUNTER — Ambulatory Visit (INDEPENDENT_AMBULATORY_CARE_PROVIDER_SITE_OTHER): Payer: Self-pay

## 2018-05-30 DIAGNOSIS — Z1211 Encounter for screening for malignant neoplasm of colon: Secondary | ICD-10-CM

## 2018-05-30 MED ORDER — PEG 3350-KCL-NA BICARB-NACL 420 G PO SOLR: 4000.0000 mL | ORAL | 0 refills | Status: DC

## 2018-05-30 MED ORDER — NA SULFATE-K SULFATE-MG SULF 17.5-3.13-1.6 GM/177ML PO SOLN: 1.0000 | ORAL | 0 refills | Status: DC

## 2018-05-30 NOTE — Progress Notes (Signed)
Gastroenterology Pre-Procedure Review  Request Date:05/30/18 Requesting Physician: Dr.Bland, no previous tcs  PATIENT REVIEW QUESTIONS: The patient responded to the following health history questions as indicated:    1. Diabetes Melitis: no 2. Joint replacements in the past 12 months: no 3. Major health problems in the past 3 months: no 4. Has an artificial valve or MVP: no 5. Has a defibrillator: no 6. Has been advised in past to take antibiotics in advance of a procedure like teeth cleaning: no 7. Family history of colon cancer: yes (pt said he thinks his brother may have had colon cancer but he is not sure, he also said he thinks his dad may have had it- Butch Penny- I cannot find any record of CRC in dads chart.)  8. Alcohol Use: yes (occasional) 9. History of sleep apnea: no  10. History of coronary artery or other vascular stents placed within the last 12 months: no 11. History of any prior anesthesia complications: no    MEDICATIONS & ALLERGIES:    Patient reports the following regarding taking any blood thinners:   Plavix? no Aspirin? no Coumadin? no Brilinta? no Xarelto? no Eliquis? no Pradaxa? no Savaysa? no Effient? no  Patient confirms/reports the following medications:  Current Outpatient Medications  Medication Sig Dispense Refill  . amLODipine (NORVASC) 10 MG tablet Take 10 mg by mouth daily.  2  . ezetimibe (ZETIA) 10 MG tablet Take 10 mg by mouth daily.  5  . gabapentin (NEURONTIN) 300 MG capsule Take 1 capsule (300 mg total) by mouth 3 (three) times daily. 90 capsule 5  . valsartan-hydrochlorothiazide (DIOVAN-HCT) 160-25 MG tablet Take 1 tablet by mouth daily.     No current facility-administered medications for this visit.     Patient confirms/reports the following allergies:  No Known Allergies  No orders of the defined types were placed in this encounter.   AUTHORIZATION INFORMATION Primary Insurance: Unknown Jim #: [id number] Pre-Cert / Josem Kaufmann  required: no   SCHEDULE INFORMATION: Procedure has been scheduled as follows:  Date: 07/11/18 Time: 9:30  Location: [facility] Dr.Rourk  This Gastroenterology Pre-Precedure Review Form is being routed to the following provider(s): Neil Crouch, PA

## 2018-05-30 NOTE — Patient Instructions (Addendum)
Carl Garza   11-Dec-1955 MRN: [id number]    Procedure Date: 07/11/18 Time to register: 8:30am Place to register: [facility] Procedure Time: 9:30am Scheduled provider: R. Garfield Cornea, MD  PREPARATION FOR COLONOSCOPY WITH TRI-LYTE SPLIT PREP  Please notify us immediately if you are diabetic, take iron supplements, or if you are on Coumadin or any other blood thinners.    You will need to purchase 1 fleet enema and 1 box of Bisacodyl '5mg'$  tablets. You can purchase these at your pharmacy.    2 DAYS BEFORE PROCEDURE:  DATE: 07/09/18  DAY: Monday Begin clear liquid diet AFTER your lunch meal. NO SOLID FOODS after this point.  1 DAY BEFORE PROCEDURE:  DATE: 07/10/18   DAY: Tuesday Continue clear liquids the entire day - NO SOLID FOOD.    At 2:00 pm:  Take 2 Bisacodyl tablets.   At 4:00pm:  Start drinking your solution. Make sure you mix well per instructions on the bottle. Try to drink 1 (one) 8 ounce glass every 10-15 minutes until you have consumed HALF the jug. You should complete by 6:00pm.You must keep the left over solution refrigerated until completed next day.  Continue clear liquids. You must drink plenty of clear liquids to prevent dehyration and kidney failure.     DAY OF PROCEDURE:   DATE: 07/11/18   DAY: Wednesday If you take medications for your heart, blood pressure or breathing, you may take these medications.   Five hours before your procedure time @ 4:30am:  Finish remaining amout of bowel prep, drinking 1 (one) 8 ounce glass every 10-15 minutes until complete. You have two hours to consume remaining prep.   Three hours before your procedure time '@6'$ :30am:  Nothing by mouth.   At least one hour before going to the hospital:  Give yourself one Fleet enema. You may take your morning medications with sip of water unless we have instructed otherwise.      Please see below for Dietary Information.  CLEAR LIQUIDS INCLUDE:  Water Jello (NOT red in  color)   Ice Popsicles (NOT red in color)   Tea (sugar ok, no milk/cream) Powdered fruit flavored drinks  Coffee (sugar ok, no milk/cream) Gatorade/ Lemonade/ Kool-Aid  (NOT red in color)   Juice: apple, white grape, white cranberry Soft drinks  Clear bullion, consomme, broth (fat free beef/chicken/vegetable)  Carbonated beverages (any kind)  Strained chicken noodle soup Hard Candy   Remember: Clear liquids are liquids that will allow you to see your fingers on the other side of a clear glass. Be sure liquids are NOT red in color, and not cloudy, but CLEAR.  DO NOT EAT OR DRINK ANY OF THE FOLLOWING:  Dairy products of any kind   Cranberry juice Tomato juice / V8 juice   Grapefruit juice Orange juice     Red grape juice  Do not eat any solid foods, including such foods as: cereal, oatmeal, yogurt, fruits, vegetables, creamed soups, eggs, bread, crackers, pureed foods in a blender, etc.   HELPFUL HINTS FOR DRINKING PREP SOLUTION:   Make sure prep is extremely cold. Mix and refrigerate the the morning of the prep. You may also put in the freezer.   You may try mixing some Crystal Light or Country Time Lemonade if you prefer. Mix in small amounts; add more if necessary.  Try drinking through a straw  Rinse mouth with water or a mouthwash between glasses, to remove after-taste.  Try sipping on a cold  beverage /ice/ popsicles between glasses of prep.  Place a piece of sugar-free hard candy in mouth between glasses.  If you become nauseated, try consuming smaller amounts, or stretch out the time between glasses. Stop for 30-60 minutes, then slowly start back drinking.        OTHER INSTRUCTIONS  You will need a responsible adult at least 60 years of age to accompany you and drive you home. This person must remain in the waiting room during your procedure. The hospital will cancel your procedure if you do not have a responsible adult with you.   1. Wear loose fitting clothing that  is easily removed. 2. Leave jewelry and other valuables at home.  3. Remove all body piercing jewelry and leave at home. 4. Total time from sign-in until discharge is approximately 2-3 hours. 5. You should go home directly after your procedure and rest. You can resume normal activities the day after your procedure. 6. The day of your procedure you should not:  Drive  Make legal decisions  Operate machinery  Drink alcohol  Return to work   You may call the office (Dept: [phone]) before 5:00pm, or page the doctor on call ([phone]) after 5:00pm, for further instructions, if necessary.   Insurance Information YOU WILL NEED TO CHECK WITH YOUR INSURANCE COMPANY FOR THE BENEFITS OF COVERAGE YOU HAVE FOR THIS PROCEDURE.  UNFORTUNATELY, NOT ALL INSURANCE COMPANIES HAVE BENEFITS TO COVER ALL OR PART OF THESE TYPES OF PROCEDURES.  IT IS YOUR RESPONSIBILITY TO CHECK YOUR BENEFITS, HOWEVER, WE WILL BE GLAD TO ASSIST YOU WITH ANY CODES YOUR INSURANCE COMPANY MAY NEED.    PLEASE NOTE THAT MOST INSURANCE COMPANIES WILL NOT COVER A SCREENING COLONOSCOPY FOR PEOPLE UNDER THE AGE OF 50  IF YOU HAVE BCBS INSURANCE, YOU MAY HAVE BENEFITS FOR A SCREENING COLONOSCOPY BUT IF POLYPS ARE FOUND THE DIAGNOSIS WILL CHANGE AND THEN YOU MAY HAVE A DEDUCTIBLE THAT WILL NEED TO BE MET. SO PLEASE MAKE SURE YOU CHECK YOUR BENEFITS FOR A SCREENING COLONOSCOPY AS WELL AS A DIAGNOSTIC COLONOSCOPY.  

## 2018-05-30 NOTE — Progress Notes (Signed)
Pt called- prep is $137.66. Sent in rx for trilyte, new instructions done and mailed to the pt.

## 2018-06-10 NOTE — Progress Notes (Signed)
Ok to schedule.

## 2018-07-11 ENCOUNTER — Other Ambulatory Visit: Payer: Self-pay

## 2018-07-11 ENCOUNTER — Encounter (HOSPITAL_COMMUNITY)
Admission: RE | Disposition: A | Discharge status: Home / Self Care | Payer: Self-pay | Source: Ambulatory Visit | Attending: Internal Medicine

## 2018-07-11 ENCOUNTER — Ambulatory Visit (HOSPITAL_COMMUNITY)
Admission: RE | Admit: 2018-07-11 | Discharge: 2018-07-11 | Disposition: A | Discharge status: Home / Self Care | Payer: 59 | Source: Ambulatory Visit | Attending: Internal Medicine | Admitting: Internal Medicine

## 2018-07-11 ENCOUNTER — Encounter (HOSPITAL_COMMUNITY): Payer: Self-pay | Admitting: *Deleted

## 2018-07-11 DIAGNOSIS — F17210 Nicotine dependence, cigarettes, uncomplicated: Secondary | ICD-10-CM | POA: Insufficient documentation

## 2018-07-11 DIAGNOSIS — I10 Essential (primary) hypertension: Secondary | ICD-10-CM | POA: Insufficient documentation

## 2018-07-11 DIAGNOSIS — K5730 Diverticulosis of large intestine without perforation or abscess without bleeding: Secondary | ICD-10-CM | POA: Insufficient documentation

## 2018-07-11 DIAGNOSIS — Z1211 Encounter for screening for malignant neoplasm of colon: Secondary | ICD-10-CM | POA: Diagnosis present

## 2018-07-11 DIAGNOSIS — Z79899 Other long term (current) drug therapy: Secondary | ICD-10-CM | POA: Diagnosis not present

## 2018-07-11 DIAGNOSIS — Z825 Family history of asthma and other chronic lower respiratory diseases: Secondary | ICD-10-CM | POA: Diagnosis not present

## 2018-07-11 DIAGNOSIS — K635 Polyp of colon: Secondary | ICD-10-CM | POA: Diagnosis not present

## 2018-07-11 DIAGNOSIS — Z800 Family history of malignant neoplasm of digestive organs: Secondary | ICD-10-CM

## 2018-07-11 HISTORY — PX: POLYPECTOMY: SHX5525

## 2018-07-11 HISTORY — PX: COLONOSCOPY: SHX5424

## 2018-07-11 SURGERY — COLONOSCOPY
Anesthesia: Moderate Sedation

## 2018-07-11 MED ORDER — ONDANSETRON HCL 4 MG/2ML IJ SOLN
INTRAMUSCULAR | Status: AC
  Filled 2018-07-11: qty 2

## 2018-07-11 MED ORDER — MIDAZOLAM HCL 5 MG/5ML IJ SOLN
INTRAMUSCULAR | Status: AC
  Filled 2018-07-11: qty 10

## 2018-07-11 MED ORDER — MEPERIDINE HCL 50 MG/ML IJ SOLN
INTRAMUSCULAR | Status: AC
  Filled 2018-07-11: qty 1

## 2018-07-11 MED ORDER — SODIUM CHLORIDE 0.9 % IV SOLN
INTRAVENOUS | Status: DC
  Administered 2018-07-11: 1000 mL via INTRAVENOUS

## 2018-07-11 MED ORDER — MEPERIDINE HCL 100 MG/ML IJ SOLN
INTRAMUSCULAR | Status: DC | PRN
  Administered 2018-07-11: 25 mg via INTRAVENOUS

## 2018-07-11 MED ORDER — MIDAZOLAM HCL 5 MG/5ML IJ SOLN
INTRAMUSCULAR | Status: DC | PRN
  Administered 2018-07-11: 2 mg via INTRAVENOUS
  Administered 2018-07-11 (×3): 1 mg via INTRAVENOUS

## 2018-07-11 NOTE — H&P (Signed)
@LOGO @   Primary Care Physician:  Lucianne Lei, MD Primary Gastroenterologist:  Dr. Gala Romney  Pre-Procedure History & Physical: HPI:  Carl Garza is a 62 y.o. male here for first ever high risk screening colonoscopy.  Brother with colon cancer diagnosed in his 25s.  Without any GI symptoms.  Past Medical History:  Diagnosis Date  . Hypertension     Past Surgical History:  Procedure Laterality Date  . LAPAROSCOPIC APPENDECTOMY N/A 11/04/2017   Procedure: APPENDECTOMY LAPAROSCOPIC;  Surgeon: Aviva Signs, MD;  Location: [facility];  Service: General;  Laterality: N/A;    Prior to Admission medications   Medication Sig Start Date End Date Taking? Authorizing Provider  amLODipine (NORVASC) 10 MG tablet Take 10 mg by mouth every evening.  09/23/17  Yes [provider]  ezetimibe (ZETIA) 10 MG tablet Take 10 mg by mouth daily. 10/03/17  Yes [provider]  gabapentin (NEURONTIN) 300 MG capsule Take 1 capsule (300 mg total) by mouth 3 (three) times daily. Patient taking differently: Take 300 mg by mouth 3 (three) times daily as needed (for back pain.).  01/10/18  Yes Carole Civil, MD  ibuprofen (ADVIL,MOTRIN) 200 MG tablet Take 400 mg by mouth every 8 (eight) hours as needed (for pain.).   Yes [provider]  polyethylene glycol-electrolytes (TRILYTE) 420 g solution Take 4,000 mLs by mouth as directed. 05/30/18  Yes Mahala Menghini, PA-C  valsartan-hydrochlorothiazide (DIOVAN-HCT) 160-25 MG tablet Take 1 tablet by mouth daily.   Yes [provider]    Allergies as of 05/30/2018  . (No Known Allergies)    Family History  Problem Relation Age of Onset  . Asthma Father     Social History   Socioeconomic History  . Marital status: Married    Spouse name: Not on file  . Number of children: Not on file  . Years of education: Not on file  . Highest education level: Not on file  Occupational History  . Not on file  Social Needs  . Financial  resource strain: Not on file  . Food insecurity:    Worry: Not on file    Inability: Not on file  . Transportation needs:    Medical: Not on file    Non-medical: Not on file  Tobacco Use  . Smoking status: Current Every Day Smoker    Packs/day: 0.50    Types: Cigarettes  . Smokeless tobacco: Never Used  Substance and Sexual Activity  . Alcohol use: Yes    Comment: on football days  . Drug use: No  . Sexual activity: Not on file  Lifestyle  . Physical activity:    Days per week: Not on file    Minutes per session: Not on file  . Stress: Not on file  Relationships  . Social connections:    Talks on phone: Not on file    Gets together: Not on file    Attends religious service: Not on file    Active member of club or organization: Not on file    Attends meetings of clubs or organizations: Not on file    Relationship status: Not on file  . Intimate partner violence:    Fear of current or ex partner: Not on file    Emotionally abused: Not on file    Physically abused: Not on file    Forced sexual activity: Not on file  Other Topics Concern  . Not on file  Social History Narrative  . Not  on file    Review of Systems: See HPI, otherwise negative ROS  Physical Exam: BP (!) 118/92   Pulse 100   Temp 98.7 F (37.1 C) (Oral)   Resp 12   Ht 5\' 8"  (1.727 m)   Wt 90.7 kg   SpO2 95%   BMI 30.41 kg/m  General:   Alert,  Well-developed, well-nourished, pleasant and cooperative in NAD Skin:  Intact without significant lesions or rashes. Eyes:  Sclera clear, no icterus.   Conjunctiva pink. Ears:  Normal auditory acuity. Nose:  No deformity, discharge,  or lesions. Mouth:  No deformity or lesions. Neck:  Supple; no masses or thyromegaly. No significant cervical adenopathy. Lungs:  Clear throughout to auscultation.   No wheezes, crackles, or rhonchi. No acute distress. Heart:  Regular rate and rhythm; no murmurs, clicks, rubs,  or gallops. Abdomen: Non-distended, normal  bowel sounds.  Soft and nontender without appreciable mass or hepatosplenomegaly.  Pulses:  Normal pulses noted. Extremities:  Without clubbing or edema.  Impression/Plan: 62 year old gentleman here for high risk screening colonoscopy as above.  The risks, benefits, limitations, alternatives and imponderables have been reviewed with the patient. Questions have been answered. All parties are agreeable.      Notice: This dictation was prepared with Dragon dictation along with smaller phrase technology. Any transcriptional errors that result from this process are unintentional and may not be corrected upon review.

## 2018-07-11 NOTE — Discharge Instructions (Signed)
°Colonoscopy °Discharge Instructions ° °Read the instructions outlined below and refer to this sheet in the next few weeks. These discharge instructions provide you with general information on caring for yourself after you leave the hospital. Your doctor may also give you specific instructions. While your treatment has been planned according to the most current medical practices available, unavoidable complications occasionally occur. If you have any problems or questions after discharge, call Dr. Rourk at [phone]. °ACTIVITY °· You may resume your regular activity, but move at a slower pace for the next 24 hours.  °· Take frequent rest periods for the next 24 hours.  °· Walking will help get rid of the air and reduce the bloated feeling in your belly (abdomen).  °· No driving for 24 hours (because of the medicine (anesthesia) used during the test).   °· Do not sign any important legal documents or operate any machinery for 24 hours (because of the anesthesia used during the test).  °NUTRITION °· Drink plenty of fluids.  °· You may resume your normal diet as instructed by your doctor.  °· Begin with a light meal and progress to your normal diet. Heavy or fried foods are harder to digest and may make you feel sick to your stomach (nauseated).  °· Avoid alcoholic beverages for 24 hours or as instructed.  °MEDICATIONS °· You may resume your normal medications unless your doctor tells you otherwise.  °WHAT YOU CAN EXPECT TODAY °· Some feelings of bloating in the abdomen.  °· Passage of more gas than usual.  °· Spotting of blood in your stool or on the toilet paper.  °IF YOU HAD POLYPS REMOVED DURING THE COLONOSCOPY: °· No aspirin products for 7 days or as instructed.  °· No alcohol for 7 days or as instructed.  °· Eat a soft diet for the next 24 hours.  °FINDING OUT THE RESULTS OF YOUR TEST °Not all test results are available during your visit. If your test results are not back during the visit, make an appointment  with your caregiver to find out the results. Do not assume everything is normal if you have not heard from your caregiver or the medical facility. It is important for you to follow up on all of your test results.  °SEEK IMMEDIATE MEDICAL ATTENTION IF: °· You have more than a spotting of blood in your stool.  °· Your belly is swollen (abdominal distention).  °· You are nauseated or vomiting.  °· You have a temperature over 101.  °· You have abdominal pain or discomfort that is severe or gets worse throughout the day.  ° ° ° °Colon polyp and diverticulosis information provided ° °Further recommendations to follow pending review of pathology report ° ° °Diverticulosis °Diverticulosis is a condition that develops when small pouches (diverticula) form in the wall of the large intestine (colon). The colon is where water is absorbed and stool is formed. The pouches form when the inside layer of the colon pushes through weak spots in the outer layers of the colon. You may have a few pouches or many of them. °What are the causes? °The cause of this condition is not known. °What increases the risk? °The following factors may make you more likely to develop this condition: °· Being older than age 60. Your risk for this condition increases with age. Diverticulosis is rare among people younger than age 30. By age 80, many people have it. °· Eating a low-fiber diet. °· Having frequent constipation. °· Being   Not getting enough exercise.  Smoking.  Taking over-the-counter pain medicines, like aspirin and ibuprofen.  Having a family history of diverticulosis.  What are the signs or symptoms? In most people, there are no symptoms of this condition. If you do have symptoms, they may include:  Bloating.  Cramps in the abdomen.  Constipation or diarrhea.  Pain in the lower left side of the abdomen.  How is this diagnosed? This condition is most often diagnosed during an exam for other colon problems.  Because diverticulosis usually has no symptoms, it often cannot be diagnosed independently. This condition may be diagnosed by:  Using a flexible scope to examine the colon (colonoscopy).  Taking an X-ray of the colon after dye has been put into the colon (barium enema).  Doing a CT scan.  How is this treated? You may not need treatment for this condition if you have never developed an infection related to diverticulosis. If you have had an infection before, treatment may include:  Eating a high-fiber diet. This may include eating more fruits, vegetables, and grains.  Taking a fiber supplement.  Taking a live bacteria supplement (probiotic).  Taking medicine to relax your colon.  Taking antibiotic medicines.  Follow these instructions at home:  Drink 6-8 glasses of water or more each day to prevent constipation.  Try not to strain when you have a bowel movement.  If you have had an infection before: ? Eat more fiber as directed by your health care provider or your diet and nutrition specialist (dietitian). ? Take a fiber supplement or probiotic, if your health care provider approves.  Take over-the-counter and prescription medicines only as told by your health care provider.  If you were prescribed an antibiotic, take it as told by your health care provider. Do not stop taking the antibiotic even if you start to feel better.  Keep all follow-up visits as told by your health care provider. This is important. Contact a health care provider if:  You have pain in your abdomen.  You have bloating.  You have cramps.  You have not had a bowel movement in 3 days. Get help right away if:  Your pain gets worse.  Your bloating becomes very bad.  You have a fever or chills, and your symptoms suddenly get worse.  You vomit.  You have bowel movements that are bloody or black.  You have bleeding from your rectum. Summary  Diverticulosis is a condition that develops when  small pouches (diverticula) form in the wall of the large intestine (colon).  You may have a few pouches or many of them.  This condition is most often diagnosed during an exam for other colon problems.  If you have had an infection related to diverticulosis, treatment may include increasing the fiber in your diet, taking supplements, or taking medicines. This information is not intended to replace advice given to you by your health care provider. Make sure you discuss any questions you have with your health care provider. Document Released: 06/09/2004 Document Revised: 08/01/2016 Document Reviewed: 08/01/2016 Elsevier Interactive Patient Education  2017 [place].  Colon Polyps Polyps are tissue growths inside the body. Polyps can grow in many places, including the large intestine (colon). A polyp may be a round bump or a mushroom-shaped growth. You could have one polyp or several. Most colon polyps are noncancerous (benign). However, some colon polyps can become cancerous over time. What are the causes? The exact cause of colon polyps is not known. What  known. °What increases the risk? °This condition is more likely to develop in people who: °· Have a family history of colon cancer or colon polyps. °· Are older than 50 or older than 45 if they are African American. °· Have inflammatory bowel disease, such as ulcerative colitis or Crohn disease. °· Are overweight. °· Smoke cigarettes. °· Do not get enough exercise. °· Drink too much alcohol. °· Eat a diet that is: °? High in fat and red meat. °? Low in fiber. °· Had childhood cancer that was treated with abdominal radiation. ° °What are the signs or symptoms? °Most polyps do not cause symptoms. If you have symptoms, they may include: °· Blood coming from your rectum when having a bowel movement. °· Blood in your stool. The stool may look dark red or black. °· A change in bowel habits, such as constipation or diarrhea. ° °How is this diagnosed? °This  condition is diagnosed with a colonoscopy. This is a procedure that uses a lighted, flexible scope to look at the inside of your colon. °How is this treated? °Treatment for this condition involves removing any polyps that are found. Those polyps will then be tested for cancer. If cancer is found, your health care provider will talk to you about options for colon cancer treatment. °Follow these instructions at home: °Diet °· Eat plenty of fiber, such as fruits, vegetables, and whole grains. °· Eat foods that are high in calcium and vitamin D, such as milk, cheese, yogurt, eggs, liver, fish, and broccoli. °· Limit foods high in fat, red meats, and processed meats, such as hot dogs, sausage, bacon, and lunch meats. °· Maintain a healthy weight, or lose weight if recommended by your health care provider. °General instructions °· Do not smoke cigarettes. °· Do not drink alcohol excessively. °· Keep all follow-up visits as told by your health care provider. This is important. This includes keeping regularly scheduled colonoscopies. Talk to your health care provider about when you need a colonoscopy. °· Exercise every day or as told by your health care provider. °Contact a health care provider if: °· You have new or worsening bleeding during a bowel movement. °· You have new or increased blood in your stool. °· You have a change in bowel habits. °· You unexpectedly lose weight. °This information is not intended to replace advice given to you by your health care provider. Make sure you discuss any questions you have with your health care provider. °Document Released: 06/08/2004 Document Revised: 02/18/2016 Document Reviewed: 08/03/2015 °Elsevier Interactive Patient Education © 2018 [employer] ° °

## 2018-07-11 NOTE — Op Note (Signed)
[facility] Patient Name: Carl Garza Procedure Date: 07/11/2018 9:05 AM MRN: [id number] Date of Birth: May 21, 1956 Attending MD: Norvel Richards , MD CSN: [id number] Age: 62 Admit Type: Outpatient Procedure:                Colonoscopy Indications:              Screening in patient at increased risk: Family                            history of 1st-degree relative with colorectal                            cancer before age 73 years Providers:                Norvel Richards, MD, Lurline Del, RN, Nelma Rothman, Technician Referring MD:              Medicines:                Midazolam 5 mg IV, Meperidine 25 mg IV Complications:            No immediate complications. Estimated Blood Loss:     Estimated blood loss was minimal. Procedure:                Pre-Anesthesia Assessment:                           - Prior to the procedure, a History and Physical                            was performed, and patient medications and                            allergies were reviewed. The patient's tolerance of                            previous anesthesia was also reviewed. The risks                            and benefits of the procedure and the sedation                            options and risks were discussed with the patient.                            All questions were answered, and informed consent                            was obtained. Prior Anticoagulants: The patient has                            taken no previous anticoagulant or antiplatelet  agents. ASA Grade Assessment: II - A patient with                            mild systemic disease. After reviewing the risks                            and benefits, the patient was deemed in                            satisfactory condition to undergo the procedure.                           After obtaining informed consent, the colonoscope                            was  passed under direct vision. Throughout the                            procedure, the patient's blood pressure, pulse, and                            oxygen saturations were monitored continuously. The                            CF-[id number] ([id number]) scope was introduced through                            the anus and advanced to the the cecum, identified                            by appendiceal orifice and ileocecal valve. The                            colonoscopy was performed without difficulty. The                            patient tolerated the procedure well. The quality                            of the bowel preparation was adequate. Scope In: 9:08:32 AM Scope Out: 9:27:23 AM Scope Withdrawal Time: 0 hours 15 minutes 3 seconds  Total Procedure Duration: 0 hours 18 minutes 51 seconds  Findings:      The perianal and digital rectal examinations were normal.      Scattered medium-mouthed diverticula were found in the entire colon.      A 5 mm polyp was found in the sigmoid colon. The polyp was sessile. The       polyp was removed with a cold snare. Resection and retrieval were       complete. Estimated blood loss was minimal.      The exam was otherwise without abnormality on direct and retroflexion       views. Impression:               - Diverticulosis in the entire examined colon.                           -  One 5 mm polyp in the sigmoid colon, removed with                            a cold snare. Resected and retrieved.                           - The examination was otherwise normal on direct                            and retroflexion views. Moderate Sedation:      Moderate (conscious) sedation was administered by the endoscopy nurse       and supervised by the endoscopist. The following parameters were       monitored: oxygen saturation, heart rate, blood pressure, respiratory       rate, EKG, adequacy of pulmonary ventilation, and response to care.       Total physician  intraservice time was 23 minutes. Recommendation:           - Patient has a contact number available for                            emergencies. The signs and symptoms of potential                            delayed complications were discussed with the                            patient. Return to normal activities tomorrow.                            Written discharge instructions were provided to the                            patient.                           - Resume previous diet.                           - Continue present medications.                           - Repeat colonoscopy date to be determined after                            pending pathology results are reviewed for                            surveillance.                           - Return to GI office (date not yet determined). Procedure Code(s):        --- Professional ---                           [phone], Colonoscopy, flexible; with removal of  tumor(s), polyp(s), or other lesion(s) by snare                            technique                           99153, Moderate sedation; each additional 15                            minutes intraservice time                           G0500, Moderate sedation services provided by the                            same physician or other qualified health care                            professional performing a gastrointestinal                            endoscopic service that sedation supports,                            requiring the presence of an independent trained                            observer to assist in the monitoring of the                            patient's level of consciousness and physiological                            status; initial 15 minutes of intra-service time;                            patient age 15 years or older (additional time may                            be reported with [phone], as appropriate) Diagnosis Code(s):         --- Professional ---                           Z80.0, Family history of malignant neoplasm of                            digestive organs                           D12.5, Benign neoplasm of sigmoid colon                           K57.30, Diverticulosis of large intestine without                            perforation or  abscess without bleeding CPT copyright 2018 [employer]. All rights reserved. The codes documented in this report are preliminary and upon coder review may  be revised to meet current compliance requirements. Cristopher Estimable. Jahdiel Krol, MD Norvel Richards, MD 07/11/2018 9:33:06 AM This report has been signed electronically. Number of Addenda: 0

## 2018-07-17 ENCOUNTER — Encounter (HOSPITAL_COMMUNITY): Payer: Self-pay | Admitting: Internal Medicine

## 2018-07-17 ENCOUNTER — Encounter: Payer: Self-pay | Admitting: Internal Medicine

## 2018-08-13 ENCOUNTER — Telehealth: Payer: Self-pay | Admitting: Orthopedic Surgery

## 2018-08-13 ENCOUNTER — Other Ambulatory Visit: Payer: Self-pay | Admitting: Orthopedic Surgery

## 2018-08-13 DIAGNOSIS — M545 Low back pain, unspecified: Secondary | ICD-10-CM

## 2018-08-13 DIAGNOSIS — G8929 Other chronic pain: Secondary | ICD-10-CM

## 2018-08-13 NOTE — Telephone Encounter (Signed)
Patient came by office to ask if another office visit is needed - last seen 05/16/18 - [place] per Dr Aline Brochure, patient does not need another appointment with our office, and he can call directly to schedule injection, as they have orders there.

## 2018-08-22 ENCOUNTER — Ambulatory Visit
Admission: RE | Admit: 2018-08-22 | Discharge: 2018-08-22 | Disposition: A | Discharge status: Home / Self Care | Payer: 59 | Source: Ambulatory Visit | Attending: Orthopedic Surgery | Admitting: Orthopedic Surgery

## 2018-08-22 DIAGNOSIS — M545 Low back pain, unspecified: Secondary | ICD-10-CM

## 2018-08-22 DIAGNOSIS — G8929 Other chronic pain: Secondary | ICD-10-CM

## 2018-08-22 MED ORDER — IOPAMIDOL (ISOVUE-M 200) INJECTION 41%
1.0000 mL | Freq: Once | INTRAMUSCULAR | Status: AC
  Administered 2018-08-22: 1 mL via EPIDURAL

## 2018-08-22 MED ORDER — METHYLPREDNISOLONE ACETATE 40 MG/ML INJ SUSP (RADIOLOG
120.0000 mg | Freq: Once | INTRAMUSCULAR | Status: AC
  Administered 2018-08-22: 120 mg via EPIDURAL

## 2018-08-22 NOTE — Discharge Instructions (Signed)

## 2018-12-01 ENCOUNTER — Other Ambulatory Visit: Payer: Self-pay | Admitting: Orthopedic Surgery

## 2019-02-24 ENCOUNTER — Encounter (HOSPITAL_COMMUNITY): Payer: Self-pay | Admitting: Emergency Medicine

## 2019-02-24 ENCOUNTER — Emergency Department (HOSPITAL_COMMUNITY)
Admission: EM | Admit: 2019-02-24 | Discharge: 2019-02-24 | Disposition: A | Discharge status: Home / Self Care | Payer: 59 | Attending: Emergency Medicine | Admitting: Emergency Medicine

## 2019-02-24 ENCOUNTER — Other Ambulatory Visit: Payer: Self-pay

## 2019-02-24 DIAGNOSIS — M19012 Primary osteoarthritis, left shoulder: Secondary | ICD-10-CM | POA: Insufficient documentation

## 2019-02-24 DIAGNOSIS — Z79899 Other long term (current) drug therapy: Secondary | ICD-10-CM | POA: Insufficient documentation

## 2019-02-24 DIAGNOSIS — M19032 Primary osteoarthritis, left wrist: Secondary | ICD-10-CM | POA: Insufficient documentation

## 2019-02-24 DIAGNOSIS — M19022 Primary osteoarthritis, left elbow: Secondary | ICD-10-CM | POA: Insufficient documentation

## 2019-02-24 DIAGNOSIS — M19029 Primary osteoarthritis, unspecified elbow: Secondary | ICD-10-CM

## 2019-02-24 DIAGNOSIS — F17210 Nicotine dependence, cigarettes, uncomplicated: Secondary | ICD-10-CM | POA: Insufficient documentation

## 2019-02-24 DIAGNOSIS — I10 Essential (primary) hypertension: Secondary | ICD-10-CM | POA: Insufficient documentation

## 2019-02-24 HISTORY — DX: Sciatica, unspecified side: M54.30

## 2019-02-24 HISTORY — DX: Other cervical disc degeneration, unspecified cervical region: M50.30

## 2019-02-24 HISTORY — DX: Other chronic pain: G89.29

## 2019-02-24 MED ORDER — KETOROLAC TROMETHAMINE 10 MG PO TABS
10.0000 mg | ORAL_TABLET | Freq: Once | ORAL | Status: AC
  Administered 2019-02-24: 15:00:00 10 mg via ORAL
  Filled 2019-02-24: qty 1

## 2019-02-24 MED ORDER — ONDANSETRON HCL 4 MG PO TABS
4.0000 mg | ORAL_TABLET | Freq: Once | ORAL | Status: AC
  Administered 2019-02-24: 15:00:00 4 mg via ORAL
  Filled 2019-02-24: qty 1

## 2019-02-24 MED ORDER — ACETAMINOPHEN 500 MG PO TABS
1000.0000 mg | ORAL_TABLET | Freq: Once | ORAL | Status: AC
  Administered 2019-02-24: 1000 mg via ORAL
  Filled 2019-02-24: qty 2

## 2019-02-24 MED ORDER — DEXAMETHASONE SODIUM PHOSPHATE 10 MG/ML IJ SOLN
10.0000 mg | Freq: Once | INTRAMUSCULAR | Status: AC
  Administered 2019-02-24: 15:00:00 10 mg via INTRAMUSCULAR
  Filled 2019-02-24: qty 1

## 2019-02-24 MED ORDER — DEXAMETHASONE 4 MG PO TABS: 4.0000 mg | ORAL_TABLET | Freq: Two times a day (BID) | ORAL | 0 refills | Status: DC

## 2019-02-24 NOTE — ED Triage Notes (Signed)
Pt presents to ED with complaints of left arm pain from elbow to wrist x 1 month.

## 2019-02-24 NOTE — ED Notes (Signed)
Pt reports a several month history of L arm pain  Has seen Dr Criss Rosales for same last week   Was Dx'd per his report with arthritis and given tramadol-  He reports his arm is no better  He has not called Dr Criss Rosales to report continued pain   Education: arthritis is a chronic condition and things that help  It include meds, exercise, and physical therapy But it is a chronic condition that ebbs and flows

## 2019-02-24 NOTE — ED Provider Notes (Signed)
[facility] EMERGENCY DEPARTMENT Provider Note   CSN: [id number] Arrival date & time: 02/24/19  1252    History   Chief Complaint Chief Complaint  Patient presents with  . Arm Pain    HPI Carl Garza is a 63 y.o. male.     The history is provided by the patient.  Arm Pain  This is a chronic problem. Episode onset: 2 months. The problem occurs daily. The problem has been rapidly worsening. Pertinent negatives include no chest pain, no abdominal pain and no shortness of breath. Nothing aggravates the symptoms. Nothing relieves the symptoms. Treatments tried: advil, tramadol. The treatment provided no relief.    Past Medical History:  Diagnosis Date  . Chronic back pain   . DDD (degenerative disc disease), cervical   . Hypertension   . Sciatica     Patient Active Problem List   Diagnosis Date Noted  . Acute appendicitis 11/04/2017  . Acute appendicitis, uncomplicated   . Chest pain 07/21/2017    Past Surgical History:  Procedure Laterality Date  . COLONOSCOPY N/A 07/11/2018   Procedure: COLONOSCOPY;  Surgeon: Daneil Dolin, MD;  Location: [facility] ENDO SUITE;  Service: Endoscopy;  Laterality: N/A;  9:30  . LAPAROSCOPIC APPENDECTOMY N/A 11/04/2017   Procedure: APPENDECTOMY LAPAROSCOPIC;  Surgeon: Aviva Signs, MD;  Location: [facility] ORS;  Service: General;  Laterality: N/A;  . POLYPECTOMY  07/11/2018   Procedure: POLYPECTOMY;  Surgeon: Daneil Dolin, MD;  Location: [facility] SU[facility];  Service: Endoscopy;;  sigmoid        Home Medications    Prior to Admission medications   Medication Sig Start Date End Date Taking? Authorizing Provider  amLODipine (NORVASC) 10 MG tablet Take 10 mg by mouth every evening.  09/23/17   [provider]  ezetimibe (ZETIA) 10 MG tablet Take 10 mg by mouth daily. 10/03/17   [provider]  gabapentin (NEURONTIN) 300 MG capsule TAKE 1 CAPSULE(300 MG) BY MOUTH THREE TIMES DAILY 12/03/18   Carole Civil, MD  ibuprofen  (ADVIL,MOTRIN) 200 MG tablet Take 400 mg by mouth every 8 (eight) hours as needed (for pain.).    [provider]  polyethylene glycol-electrolytes (TRILYTE) 420 g solution Take 4,000 mLs by mouth as directed. 05/30/18   Mahala Menghini, PA-C  valsartan-hydrochlorothiazide (DIOVAN-HCT) 160-25 MG tablet Take 1 tablet by mouth daily.    [provider]    Family History Family History  Problem Relation Age of Onset  . Asthma Father     Social History Social History   Tobacco Use  . Smoking status: Current Every Day Smoker    Packs/day: 0.50    Types: Cigarettes  . Smokeless tobacco: Never Used  Substance Use Topics  . Alcohol use: Yes    Comment: on football days  . Drug use: No     Allergies   Patient has no known allergies.   Review of Systems Review of Systems  Constitutional: Negative for activity change.       All ROS Neg except as noted in HPI  HENT: Negative for nosebleeds.   Eyes: Negative for photophobia and discharge.  Respiratory: Negative for cough, shortness of breath and wheezing.   Cardiovascular: Negative for chest pain and palpitations.  Gastrointestinal: Negative for abdominal pain and blood in stool.  Genitourinary: Negative for dysuria, frequency and hematuria.  Musculoskeletal: Positive for arthralgias. Negative for back pain and neck pain.  Skin: Negative.   Neurological: Negative for dizziness, seizures and speech  difficulty.  Psychiatric/Behavioral: Negative for confusion and hallucinations.     Physical Exam Updated Vital Signs BP (!) 122/93 (BP Location: Right Arm)   Pulse 86   Temp 98.3 F (36.8 C) (Oral)   Resp 16   Ht 5\' 8"  (1.727 m)   Wt 92.5 kg   SpO2 95%   BMI 31.02 kg/m   Physical Exam Vitals signs and nursing note reviewed.  Constitutional:      Appearance: He is well-developed. He is not toxic-appearing.  HENT:     Head: Normocephalic.     Right Ear: Tympanic membrane and external ear normal.     Left  Ear: Tympanic membrane and external ear normal.  Eyes:     General: Lids are normal.     Pupils: Pupils are equal, round, and reactive to light.  Neck:     Musculoskeletal: Normal range of motion and neck supple.     Vascular: No carotid bruit.  Cardiovascular:     Rate and Rhythm: Normal rate and regular rhythm.     Pulses: Normal pulses.     Heart sounds: Normal heart sounds.  Pulmonary:     Effort: No respiratory distress.     Breath sounds: Normal breath sounds.  Abdominal:     General: Bowel sounds are normal.     Palpations: Abdomen is soft.     Tenderness: There is no abdominal tenderness. There is no guarding.  Musculoskeletal: Normal range of motion.     Comments: FROM of the left shoulder, elbow and wrist. FROM of the fingers of the left hand. No hot joints. No dislocations. Radial pulse 2+ Degenerative joint disease changes noted of the wrist and fingers of right and left hand.  Lymphadenopathy:     Head:     Right side of head: No submandibular adenopathy.     Left side of head: No submandibular adenopathy.     Cervical: No cervical adenopathy.  Skin:    General: Skin is warm and dry.  Neurological:     Mental Status: He is alert and oriented to person, place, and time.     Cranial Nerves: No cranial nerve deficit.     Sensory: No sensory deficit.  Psychiatric:        Speech: Speech normal.      ED Treatments / Results  Labs (all labs ordered are listed, but only abnormal results are displayed) Labs Reviewed - No data to display  EKG None  Radiology No results found.  Procedures Procedures (including critical care time)  Medications Ordered in ED Medications - No data to display   Initial Impression / Assessment and Plan / ED Course  I have reviewed the triage vital signs and the nursing notes.  Pertinent labs & imaging results that were available during my care of the patient were reviewed by me and considered in my medical decision making (see  chart for details).          Final Clinical Impressions(s) / ED Diagnoses MDM  Vital signs reviewed.  Pulse oximetry is 95 to 96% on room air.  Within normal limits by my interpretation.  No neurovascular deficits noted of the right or left upper extremity.  Patient has a history of degenerative disc disease and a history of arthritis.  I have asked the patient to add Decadron to his current medications.  I have asked him to use Tylenol extra strength with breakfast, lunch, dinner, and at bedtime.  He is to continue his  Ultram as prescribed by his primary physician.  Patient knowledges understanding of these instructions.   Final diagnoses:  Osteoarthritis of upper extremity    ED Discharge Orders         Ordered    dexamethasone (DECADRON) 4 MG tablet  2 times daily with meals     02/24/19 1516           Lily Kocher, PA-C 02/24/19 [place], McDonald, DO 02/28/19 1756

## 2019-02-24 NOTE — ED Notes (Signed)
Pt w history of DDD  Denies any recent falls or injury

## 2019-02-24 NOTE — Discharge Instructions (Signed)
Please use 1000 mg of Tylenol with breakfast, lunch, dinner, and at bedtime.  Please add Decadron 2 times daily with food.  May continue to use your Ultram/tramadol for more severe pain.  Please discuss this with Dr. Margorie John if this problem continues.

## 2019-03-17 IMAGING — CT CT ABD-PELV W/ CM
3 of 9 series · 12 of 36 positions shown, 13 images · IV contrast (iopamidol)
Comparison: None.

CLINICAL DATA: Per ED Note: Right sided abd pain for the past 4
days. Intermittent left leg numbness x 2 weeks. Per DR Mayara scan
Abd / pelvis @ 59sec and scan neck asap Iso 300 100ml^100mL
U5XUU7-IJJ IOPAMIDOL (U5XUU7-IJJ) INJECTION 61%

EXAM:
CT ABDOMEN AND PELVIS WITH CONTRAST
TECHNIQUE: Multidetector CT imaging of the abdomen and pelvis was performed
using the standard protocol following bolus administration of
intravenous contrast.
CONTRAST:  100mL U5XUU7-IJJ IOPAMIDOL (U5XUU7-IJJ) INJECTION 61%

[Series 3: cap with · axial · 0.77mm/px · z∈[-754,-229]mm · 3 of 106 slices shown, 4 images]
[im 1/106  mediastinal]
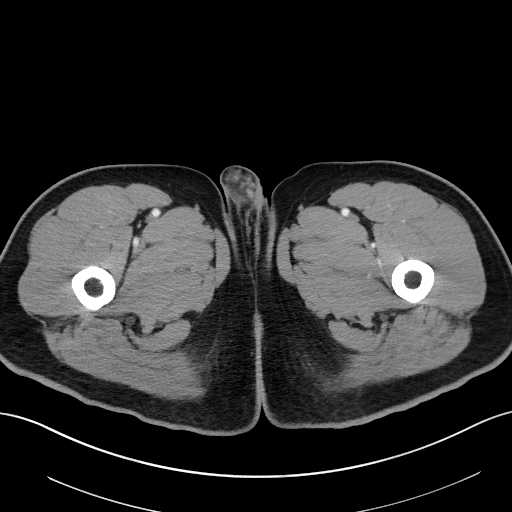
[im 1/106  lung]
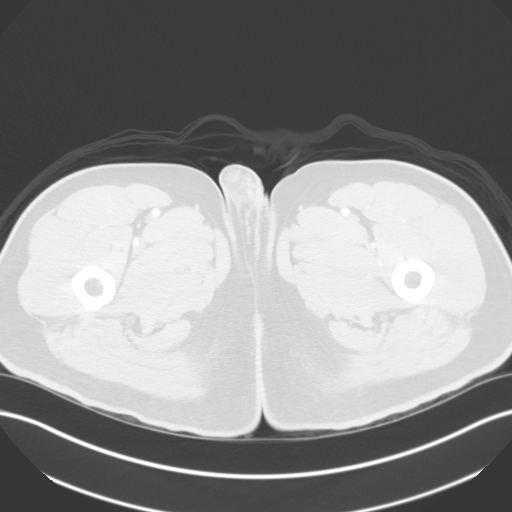
[im 53/106  lung]
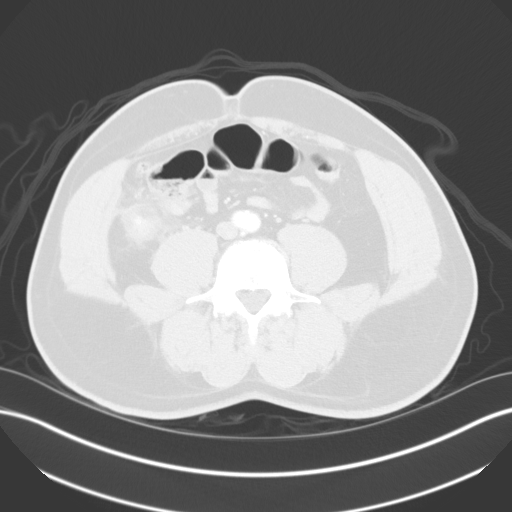
[im 106/106  lung]
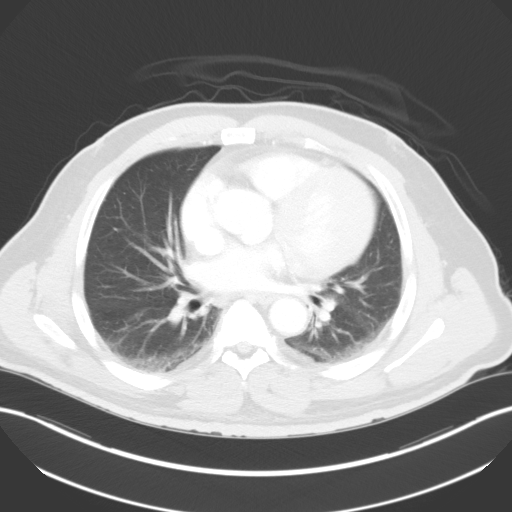

[Series 4: thins · axial · 0.78mm/px · z∈[-642,-281]mm · 8 of 664 slices shown]
[im 74/664  lung]
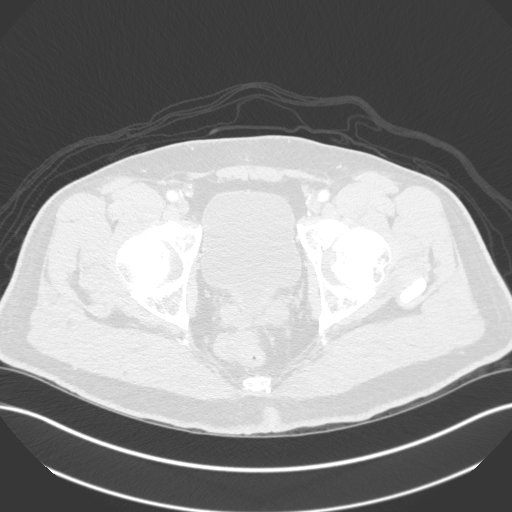
[im 148/664  lung]
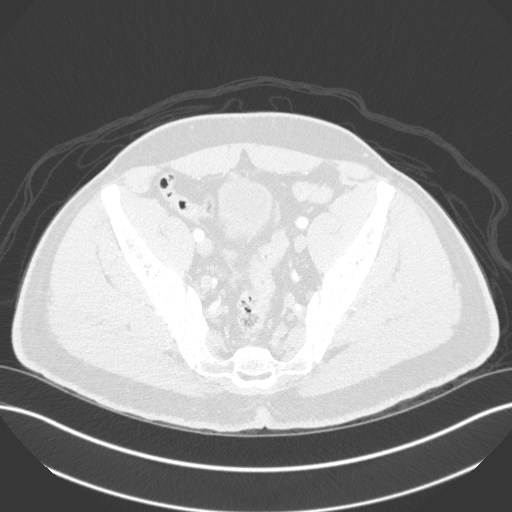
[im 222/664  lung]
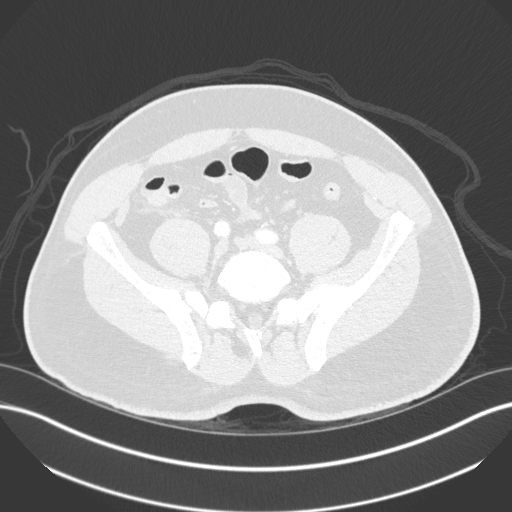
[im 295/664  lung]
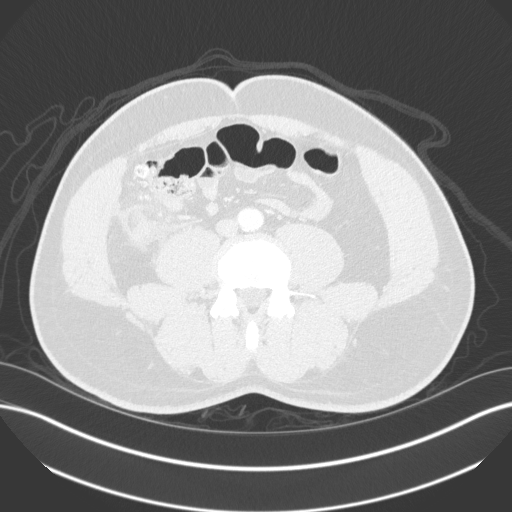
[im 369/664  lung]
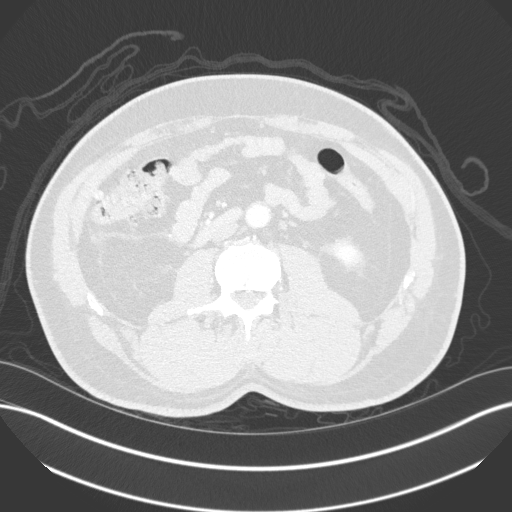
[im 443/664  lung]
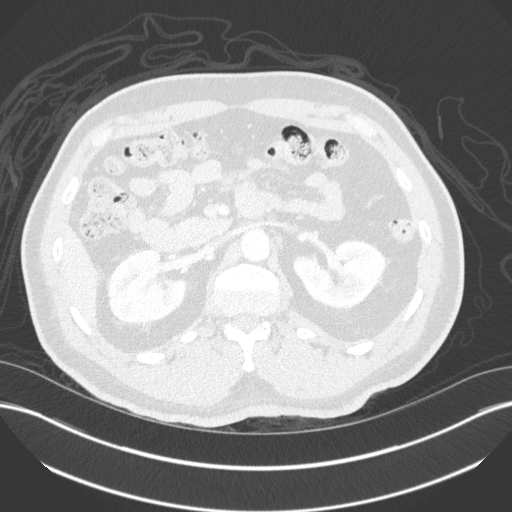
[im 516/664  lung]
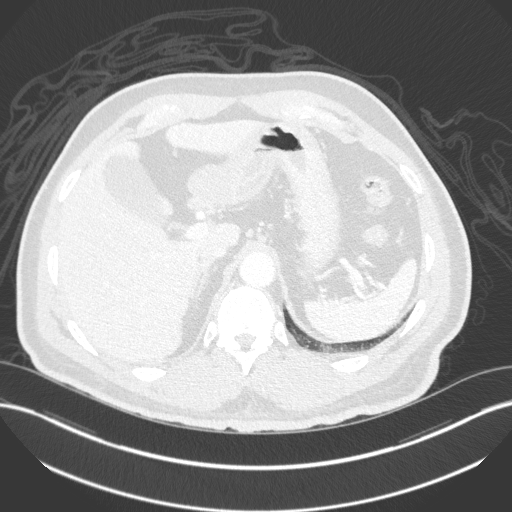
[im 590/664  lung]
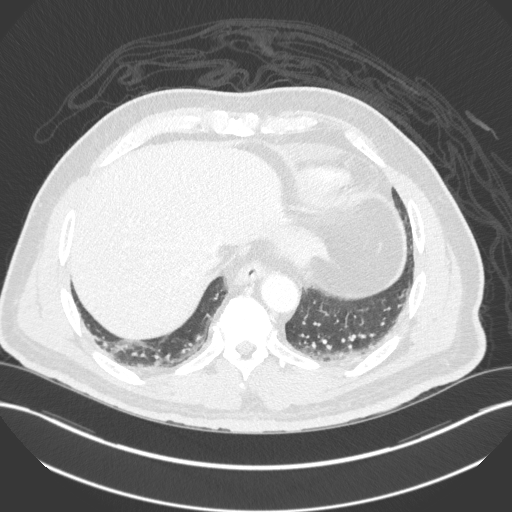

[Series 6: coronals · coronal · 0.90mm/px · 1 of 128 slices shown]
[im 64/128  lung]
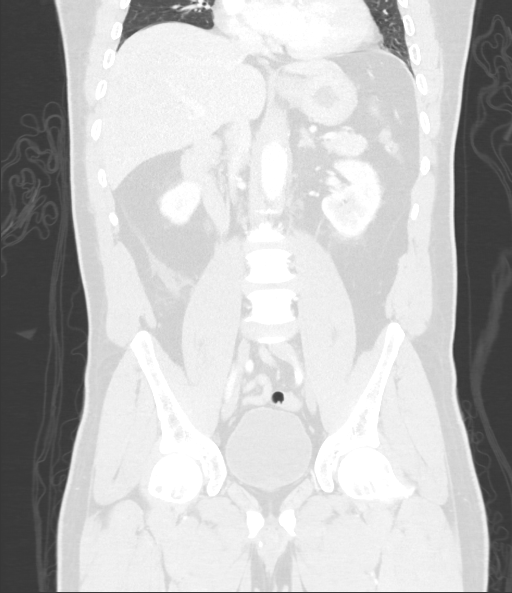

[12 of 36 positions shown; findings below may reference images not displayed]

FINDINGS: Lower chest: Mild basilar atelectasis.

Hepatobiliary: No focal hepatic lesion. No biliary duct dilatation.
Gallbladder is normal. Common bile duct is normal.

Pancreas: Pancreas is normal. No ductal dilatation. No pancreatic
inflammation.

Spleen: Normal spleen

Adrenals/urinary tract: Adrenal glands and kidneys are normal. The
ureters and bladder normal.

Stomach/Bowel: Stomach, small bowel and terminal ileum are normal.
The appendix is thickened and hyperemic measuring 11 mm in diameter
(image 58, series 3).

Rather robust inflammation in the adjacent fat. Small amount fluid
along the RIGHT pericolic gutter. Suspected appendicolith within the
distal portion of the appendix (image 54, series 6). The appendix
extends retrocecal and cephalad along the RIGHT pericolic gutter.
(Image 54, series 6). Fairly robust inflammation surrounding the
appendix.

Vascular/Lymphatic: Colon rectosigmoid normal

Reproductive: Prostate normal

Other: No free fluid.

Musculoskeletal: No aggressive osseous lesion.
IMPRESSION: 1. Acute appendicitis with robust inflammatory reaction in the RIGHT
lower quadrant. No evidence of macro perforation.
2.  Appendix: Location: Retrocecal
Diameter: 11 mm
Appendicolith: Potential
Mucosal hyper-enhancement: Present
Extraluminal gas: Absent
Periappendiceal collection: Minimal

Findings conveyed Serge Santerre Kankonda Elese on 11/04/2017  at[DATE].

## 2019-05-14 ENCOUNTER — Other Ambulatory Visit: Payer: Self-pay | Admitting: Orthopedic Surgery

## 2019-11-04 ENCOUNTER — Other Ambulatory Visit: Payer: Self-pay | Admitting: Orthopedic Surgery

## 2020-03-15 ENCOUNTER — Inpatient Hospital Stay (HOSPITAL_COMMUNITY)
Admission: EM | Admit: 2020-03-15 | Discharge: 2020-03-17 | DRG: 062 | Disposition: A | Discharge status: Home / Self Care | Payer: 59 | Attending: Neurology | Admitting: Neurology

## 2020-03-15 ENCOUNTER — Emergency Department (HOSPITAL_COMMUNITY): Payer: 59

## 2020-03-15 ENCOUNTER — Other Ambulatory Visit: Payer: Self-pay

## 2020-03-15 ENCOUNTER — Encounter (HOSPITAL_COMMUNITY): Payer: Self-pay | Admitting: *Deleted

## 2020-03-15 DIAGNOSIS — Z20822 Contact with and (suspected) exposure to covid-19: Secondary | ICD-10-CM | POA: Diagnosis present

## 2020-03-15 DIAGNOSIS — E785 Hyperlipidemia, unspecified: Secondary | ICD-10-CM | POA: Diagnosis present

## 2020-03-15 DIAGNOSIS — E669 Obesity, unspecified: Secondary | ICD-10-CM | POA: Diagnosis present

## 2020-03-15 DIAGNOSIS — F17210 Nicotine dependence, cigarettes, uncomplicated: Secondary | ICD-10-CM | POA: Diagnosis present

## 2020-03-15 DIAGNOSIS — Z6831 Body mass index (BMI) 31.0-31.9, adult: Secondary | ICD-10-CM | POA: Diagnosis not present

## 2020-03-15 DIAGNOSIS — I639 Cerebral infarction, unspecified: Secondary | ICD-10-CM | POA: Diagnosis not present

## 2020-03-15 DIAGNOSIS — R0683 Snoring: Secondary | ICD-10-CM | POA: Diagnosis present

## 2020-03-15 DIAGNOSIS — R29810 Facial weakness: Secondary | ICD-10-CM | POA: Diagnosis present

## 2020-03-15 DIAGNOSIS — R29701 NIHSS score 1: Secondary | ICD-10-CM | POA: Diagnosis present

## 2020-03-15 DIAGNOSIS — Z825 Family history of asthma and other chronic lower respiratory diseases: Secondary | ICD-10-CM

## 2020-03-15 DIAGNOSIS — Z23 Encounter for immunization: Secondary | ICD-10-CM | POA: Diagnosis not present

## 2020-03-15 DIAGNOSIS — I63511 Cerebral infarction due to unspecified occlusion or stenosis of right middle cerebral artery: Principal | ICD-10-CM | POA: Diagnosis present

## 2020-03-15 DIAGNOSIS — E876 Hypokalemia: Secondary | ICD-10-CM | POA: Diagnosis present

## 2020-03-15 DIAGNOSIS — G8194 Hemiplegia, unspecified affecting left nondominant side: Secondary | ICD-10-CM | POA: Diagnosis present

## 2020-03-15 DIAGNOSIS — I6389 Other cerebral infarction: Secondary | ICD-10-CM | POA: Diagnosis present

## 2020-03-15 DIAGNOSIS — I10 Essential (primary) hypertension: Secondary | ICD-10-CM | POA: Diagnosis present

## 2020-03-15 DIAGNOSIS — I6300 Cerebral infarction due to thrombosis of unspecified precerebral artery: Secondary | ICD-10-CM

## 2020-03-15 LAB — CBC WITH DIFFERENTIAL/PLATELET
Abs Immature Granulocytes: 0.02 10*3/uL (ref 0.00–0.07)
Basophils Absolute: 0.1 10*3/uL (ref 0.0–0.1)
Basophils Relative: 1 %
Eosinophils Absolute: 0.3 10*3/uL (ref 0.0–0.5)
Eosinophils Relative: 2 %
HCT: 39.1 % (ref 39.0–52.0)
Hemoglobin: 12.9 g/dL — ABNORMAL LOW (ref 13.0–17.0)
Immature Granulocytes: 0 %
Lymphocytes Relative: 24 %
Lymphs Abs: 2.8 10*3/uL (ref 0.7–4.0)
MCH: 30.2 pg (ref 26.0–34.0)
MCHC: 33 g/dL (ref 30.0–36.0)
MCV: 91.6 fL (ref 80.0–100.0)
Monocytes Absolute: 1.1 10*3/uL — ABNORMAL HIGH (ref 0.1–1.0)
Monocytes Relative: 9 %
Neutro Abs: 7.3 10*3/uL (ref 1.7–7.7)
Neutrophils Relative %: 64 %
Platelets: 321 10*3/uL (ref 150–400)
RBC: 4.27 MIL/uL (ref 4.22–5.81)
RDW: 14.4 % (ref 11.5–15.5)
WBC: 11.5 10*3/uL — ABNORMAL HIGH (ref 4.0–10.5)
nRBC: 0 % (ref 0.0–0.2)

## 2020-03-15 LAB — I-STAT CHEM 8, ED
BUN: 19 mg/dL (ref 8–23)
Calcium, Ion: 1.18 mmol/L (ref 1.15–1.40)
Chloride: 104 mmol/L (ref 98–111)
Creatinine, Ser: 1.9 mg/dL — ABNORMAL HIGH (ref 0.61–1.24)
Glucose, Bld: 111 mg/dL — ABNORMAL HIGH (ref 70–99)
HCT: 39 % (ref 39.0–52.0)
Hemoglobin: 13.3 g/dL (ref 13.0–17.0)
Potassium: 3.2 mmol/L — ABNORMAL LOW (ref 3.5–5.1)
Sodium: 142 mmol/L (ref 135–145)
TCO2: 25 mmol/L (ref 22–32)

## 2020-03-15 LAB — COMPREHENSIVE METABOLIC PANEL
ALT: 48 U/L — ABNORMAL HIGH (ref 0–44)
AST: 29 U/L (ref 15–41)
Albumin: 4.1 g/dL (ref 3.5–5.0)
Alkaline Phosphatase: 79 U/L (ref 38–126)
Anion gap: 11 (ref 5–15)
BUN: 20 mg/dL (ref 8–23)
CO2: 24 mmol/L (ref 22–32)
Calcium: 9.2 mg/dL (ref 8.9–10.3)
Chloride: 106 mmol/L (ref 98–111)
Creatinine, Ser: 1.64 mg/dL — ABNORMAL HIGH (ref 0.61–1.24)
GFR calc Af Amer: 52 mL/min — ABNORMAL LOW (ref >60)
GFR calc non Af Amer: 44 mL/min — ABNORMAL LOW (ref >60)
Glucose, Bld: 112 mg/dL — ABNORMAL HIGH (ref 70–99)
Potassium: 3.3 mmol/L — ABNORMAL LOW (ref 3.5–5.1)
Sodium: 141 mmol/L (ref 135–145)
Total Bilirubin: 0.4 mg/dL (ref 0.3–1.2)
Total Protein: 8 g/dL (ref 6.5–8.1)

## 2020-03-15 LAB — ETHANOL: Alcohol, Ethyl (B): 101 mg/dL — ABNORMAL HIGH (ref <10)

## 2020-03-15 LAB — PROTIME-INR
INR: 1 (ref 0.8–1.2)
Prothrombin Time: 13.1 seconds (ref 11.4–15.2)

## 2020-03-15 LAB — APTT: aPTT: 32 seconds (ref 24–36)

## 2020-03-15 LAB — CBG MONITORING, ED: Glucose-Capillary: 105 mg/dL — ABNORMAL HIGH (ref 70–99)

## 2020-03-15 LAB — SARS CORONAVIRUS 2 BY RT PCR (HOSPITAL ORDER, PERFORMED IN ~~LOC~~ HOSPITAL LAB): SARS Coronavirus 2: NEGATIVE

## 2020-03-15 MED ORDER — ALTEPLASE 100 MG IV SOLR
INTRAVENOUS | Status: AC
  Administered 2020-03-15: 83.3 mg via INTRAVENOUS
  Filled 2020-03-15: qty 100

## 2020-03-15 MED ORDER — SODIUM CHLORIDE 0.9 % IV SOLN
50.0000 mL | Freq: Once | INTRAVENOUS | Status: AC
  Administered 2020-03-15: 50 mL via INTRAVENOUS

## 2020-03-15 MED ORDER — ALTEPLASE (STROKE) FULL DOSE INFUSION
0.9000 mg/kg | Freq: Once | INTRAVENOUS | Status: AC
  Filled 2020-03-15: qty 100

## 2020-03-15 NOTE — ED Notes (Signed)
Pt taken back to CT.

## 2020-03-15 NOTE — Progress Notes (Signed)
Call time 2104  Exam started 21008 Exam ended 2109 Images sent in epic 2111  Deneise Lever er  DR. Roderic Palau    [phone]

## 2020-03-15 NOTE — ED Notes (Signed)
Paged Dr Cheral Marker to Dr Roderic Palau @ [phone]

## 2020-03-15 NOTE — Consult Note (Addendum)
[facility] Tele[facility]urology Consult Services   Date of Service:   03/15/2020 21:18:15  Impression:       I63.9 - Cerebrovascular accident (CVA), unspecified mechanism ([place])  Comments/Sign-Out: The patient is a 64 year old man with a history of hypertension, hyperlipidemia, who presents with left sided weakness and dizziness, felt most likely to represent CVA. Alteplase inclusion and exclusion criteria were reviewed, patient felt to be a good candidate, and alteplase administered. Routine post-alteplase management as detailed below. Recommend evaluation of vascular risk factors with MRI brain, MRA head/neck, TTE, lipid panel, HgbA1C. Tolerate permissive hypertension with BP goal < 185/110.  Metrics: Last Known Well: 03/15/2020 20:30:00 [facility] Notification Time: 03/15/2020 21:18:15 Arrival Time: 03/15/2020 20:58:00 Stamp Time: 03/15/2020 21:18:15 Time First Login Attempt: 03/15/2020 21:24:05 Symptoms: left sided weakness NIHSS Start Assessment Time: 03/15/2020 21:25:56 Alteplase Early Mix Decision Time: 03/15/2020 21:31:02 Patient is a candidate for Alteplase/Activase. Alteplase Medical Decision: 03/15/2020 21:31:02 Alteplase/Activase CPOE Order Time: 03/15/2020 21:37:07 Needle Time: 03/15/2020 21:39:00 Weight Noted by Staff: 92.5 kg  CT head showed no acute hemorrhage or acute core infarct. CT head was reviewed and results were: 1. No acute intracranial infarct or other abnormality. 2. ASPECTS is 10. 3. Age-related cerebral atrophy with mild-to-moderate chronic microvascular ischemic disease.  ED Physician notified of diagnostic impression and management plan on 03/15/2020 21:44:58  Advanced Imaging: Advanced Imaging Not Recommended because:  Clinical Presentation is not Suggestive of LVO and NIHSS is <6   Alteplase/Activase Contraindications:  Last Known Well > 4.5 hours: No CT Head showing hemorrhage: No Ischemic stroke within 3 months:  No Severe head trauma within 3 months: No Intracranial/intraspinal surgery within 3 months: No History of intracranial hemorrhage: No Symptoms and signs consistent with an SAH: No GI malignancy or GI bleed within 21 days: No Coagulopathy: Platelets <100 000/mm3, INR >1.7, aPTT>40 s, or PT >15 s: No Treatment dose of LMWH within the previous 24 hrs: No Use of NOACs in past 48 hours: No Glycoprotein IIb/IIIa receptor inhibitors use: No Symptoms consistent with infective endocarditis: No Suspected aortic arch dissection: No Intra-axial intracranial neoplasm: No  Verbal Consent to Alteplase/Activase: I have explained to the Patient the nature of the patients condition, reviewed the indications and contraindications to the use of Alteplase/Activase fibrinolytic agent, reviewed the indications and contraindications and the benefits to be reasonably expected compared with alternative approaches. I have discussed the likelihood of major risks or complications of this procedure including (if applicable) but not limited to loss of limb function, brain damage, paralysis, hemorrhage, infection, complications from transfusion of blood components, drug reactions, blood clots and loss of life. I have also indicated that with any procedure there is always the possibility of an unexpected complication. All questions were answered and Patient express understanding of the treatment plan and consent to the treatment.  Our recommendations are outlined below.  Recommendations: IV Alteplase/Activase recommended.  Alteplase/Activase bolus given Without Complication.   IV Alteplase/Activase Total Dose - 83.3 mg IV Alteplase/Activase Bolus Dose - 8.3 mg IV Alteplase/Activase Infusion Dose - 74.9 mg  Routine post Alteplase/Activase monitoring including neuro checks and blood pressure control during/after treatment Monitor blood pressure Check blood pressure and neuro assessment every 15 min for 2 h, then every  30 min for 6 h, and finally every hour for 16 h.  Manage Blood Pressure per post Alteplase/Activase protocol.        Admission to ICU       CT brain 24 hours post Alteplase/Activase  NPO until swallowing screen performed and passed       No antiplatelet agents or anticoagulants (including heparin for DVT prophylaxis) in first 24 hours       No Foley catheter, nasogastric tube, arterial catheter or central venous catheter for 24 hr, unless absolutely necessary       Telemetry       Bedside swallow evaluation       HOB less than 30 degrees       Euglycemia       Avoid hyperthermia, PRN acetaminophen       DVT prophylaxis       Inpatient Neurology Consultation       Stroke evaluation as per inpatient neurology recommendations  Discussed with ED physician    ------------------------------------------------------------------------------  History of Present Illness: Patient is a 64 year old Male.  Patient was brought by private transportation with symptoms of left sided weakness  Patient developed acute dizziness and left sided weakness at 8:30 PM. In ER noted to be having weakness of left face and decreased hand grip. Dizziness resolved. No prior similar symptoms, no prior L hand weakness. Past medical history includes appendicitis  There is no history of hemorrhagic complications or intracranial hemorrhage. There is no history of Recent Anticoagulants. There is no history of recent major surgery. There is no history of recent stroke.  Past Medical History:      Hypertension      Hyperlipidemia      There is NO history of Diabetes Mellitus      There is NO history of Atrial Fibrillation      There is NO history of Coronary Artery Disease      There is NO history of Stroke  Anticoagulant use:  No  Antiplatelet use: No   Examination: BP(123/90 -> 115/91), Pulse(102), Blood Glucose(pending) 1A: Level of Consciousness - Alert; keenly responsive +  0 1B: Ask Month and Age - Both Questions Right + 0 1C: Blink Eyes & Squeeze Hands - Performs Both Tasks + 0 2: Test Horizontal Extraocular Movements - Normal + 0 3: Test Visual Fields - No Visual Loss + 0 4: Test Facial Palsy (Use Grimace if Obtunded) - Minor paralysis (flat nasolabial fold, smile asymmetry) + 1 5A: Test Left Arm Motor Drift - No Drift for 10 Seconds + 0 5B: Test Right Arm Motor Drift - No Drift for 10 Seconds + 0 6A: Test Left Leg Motor Drift - No Drift for 5 Seconds + 0 6B: Test Right Leg Motor Drift - No Drift for 5 Seconds + 0 7: Test Limb Ataxia (FNF/Heel-Shin) - No Ataxia + 0 8: Test Sensation - Normal; No sensory loss + 0 9: Test Language/Aphasia - Normal; No aphasia + 0 10: Test Dysarthria - Normal + 0 11: Test Extinction/Inattention - No abnormality + 0  NIHSS Score: 1  Pre-Morbid Modified Ranking Scale: 0 Points = No symptoms at all   Patient/Family was informed the Neurology Consult would occur via TeleHealth consult by way of interactive audio and video telecommunications and consented to receiving care in this manner.   Patient is being evaluated for possible acute neurologic impairment and high probability of imminent or life-threatening deterioration. I spent total of 30 minutes providing care to this patient, including time for face to face visit via telemedicine, review of medical records, imaging studies and discussion of findings with providers, the patient and/or family.   Dr Serita Grammes   [facility] [phone]  Case [id number]  ADDENDUM Following  Alteplase patient reported onset of headache.  Alteplase held and STAT repeat HCT negative for bleed or acute changes. OK to restart alteplase.

## 2020-03-15 NOTE — ED Provider Notes (Signed)
[facility] EMERGENCY DEPARTMENT Provider Note   CSN: [id number] Arrival date & time: 03/15/20  2058  An emergency department physician performed an initial assessment on this suspected stroke patient at 2109.  History Chief Complaint  Patient presents with  . Dizziness    Carl Garza is a 64 y.o. male.  Patient states he had some dizziness and then vomited.  Then felt weak.  When he came to the emergency department he had some mild left facial droop and a code stroke was started..  Symptoms started 8:30 PM  The history is provided by the patient and medical records.  Dizziness Quality:  Lightheadedness Severity:  Mild Onset quality:  Sudden Timing:  Constant Progression:  Waxing and waning Chronicity:  New Context: not when bending over   Associated symptoms: no chest pain, no diarrhea and no headaches        Past Medical History:  Diagnosis Date  . Chronic back pain   . DDD (degenerative disc disease), cervical   . Hypertension   . Sciatica     Patient Active Problem List   Diagnosis Date Noted  . Acute appendicitis 11/04/2017  . Acute appendicitis, uncomplicated   . Chest pain 07/21/2017    Past Surgical History:  Procedure Laterality Date  . COLONOSCOPY N/A 07/11/2018   Procedure: COLONOSCOPY;  Surgeon: Daneil Dolin, MD;  Location: [facility];  Service: Endoscopy;  Laterality: N/A;  9:30  . LAPAROSCOPIC APPENDECTOMY N/A 11/04/2017   Procedure: APPENDECTOMY LAPAROSCOPIC;  Surgeon: Aviva Signs, MD;  Location: [facility] ORS;  Service: General;  Laterality: N/A;  . POLYPECTOMY  07/11/2018   Procedure: POLYPECTOMY;  Surgeon: Daneil Dolin, MD;  Location: [facility];  Service: Endoscopy;;  sigmoid       Family History  Problem Relation Age of Onset  . Asthma Father     Social History   Tobacco Use  . Smoking status: Current Every Day Smoker    Packs/day: 0.50    Types: Cigarettes  . Smokeless tobacco: Never Used  Vaping Use  . Vaping Use:  Never used  Substance Use Topics  . Alcohol use: Yes    Comment: on football days  . Drug use: No    Home Medications Prior to Admission medications   Medication Sig Start Date End Date Taking? Authorizing Provider  amLODipine (NORVASC) 10 MG tablet Take 10 mg by mouth every evening.  09/23/17  Yes [provider]  ezetimibe (ZETIA) 10 MG tablet Take 10 mg by mouth every morning.  10/03/17  Yes [provider]  gabapentin (NEURONTIN) 300 MG capsule TAKE 1 CAPSULE(300 MG) BY MOUTH THREE TIMES DAILY Patient taking differently: Take 300 mg by mouth 3 (three) times daily.  11/04/19  Yes Carole Civil, MD  ibuprofen (ADVIL,MOTRIN) 200 MG tablet Take 400 mg by mouth every 8 (eight) hours as needed (for pain.).   Yes [provider]  Multiple Vitamin (MULTIVITAMIN WITH MINERALS) TABS tablet Take 1 tablet by mouth every morning.   Yes [provider]  potassium chloride SA (KLOR-CON) 20 MEQ tablet Take 20 mEq by mouth every morning.  03/03/20  Yes [provider]  valsartan-hydrochlorothiazide (DIOVAN-HCT) 160-25 MG tablet Take 1 tablet by mouth every morning.    Yes [provider]    Allergies    Patient has no known allergies.  Review of Systems   Review of Systems  Constitutional: Negative for appetite change and fatigue.  HENT: Negative for congestion, ear discharge  and sinus pressure.   Eyes: Negative for discharge.  Respiratory: Negative for cough.   Cardiovascular: Negative for chest pain.  Gastrointestinal: Negative for abdominal pain and diarrhea.  Genitourinary: Negative for frequency and hematuria.  Musculoskeletal: Negative for back pain.  Skin: Negative for rash.  Neurological: Positive for dizziness. Negative for seizures and headaches.  Psychiatric/Behavioral: Negative for hallucinations.    Physical Exam Updated Vital Signs BP 98/85   Pulse (!) 106   Temp 98.4 F (36.9 C) (Oral)   Resp 19   Ht 5\' 8"  (1.727 m)    Wt 92.5 kg   SpO2 96%   BMI 31.02 kg/m   Physical Exam Vitals and nursing note reviewed.  Constitutional:      Appearance: He is well-developed.  HENT:     Head: Normocephalic.     Nose: Nose normal.  Eyes:     General: No scleral icterus.    Conjunctiva/sclera: Conjunctivae normal.  Neck:     Thyroid: No thyromegaly.  Cardiovascular:     Rate and Rhythm: Normal rate and regular rhythm.     Heart sounds: No murmur heard.  No friction rub. No gallop.   Pulmonary:     Breath sounds: No stridor. No wheezing or rales.  Chest:     Chest wall: No tenderness.  Abdominal:     General: There is no distension.     Tenderness: There is no abdominal tenderness. There is no rebound.  Musculoskeletal:        General: Normal range of motion.     Cervical back: Neck supple.     Comments: Minimal weakness in the right hand with a prescription  Lymphadenopathy:     Cervical: No cervical adenopathy.  Skin:    Findings: No erythema or rash.  Neurological:     Mental Status: He is alert and oriented to person, place, and time.     Motor: No abnormal muscle tone.     Coordination: Coordination normal.     Comments: Minimal left facial weakness  Psychiatric:        Behavior: Behavior normal.     ED Results / Procedures / Treatments   Labs (all labs ordered are listed, but only abnormal results are displayed) Labs Reviewed  ETHANOL - Abnormal; Notable for the following components:      Result Value   Alcohol, Ethyl (B) 101 (*)    All other components within normal limits  COMPREHENSIVE METABOLIC PANEL - Abnormal; Notable for the following components:   Potassium 3.3 (*)    Glucose, Bld 112 (*)    Creatinine, Ser 1.64 (*)    ALT 48 (*)    GFR calc non Af Amer 44 (*)    GFR calc Af Amer 52 (*)    All other components within normal limits  CBC WITH DIFFERENTIAL/PLATELET - Abnormal; Notable for the following components:   WBC 11.5 (*)    Hemoglobin 12.9 (*)    Monocytes  Absolute 1.1 (*)    All other components within normal limits  CBG MONITORING, ED - Abnormal; Notable for the following components:   Glucose-Capillary 105 (*)    All other components within normal limits  I-STAT CHEM 8, ED - Abnormal; Notable for the following components:   Potassium 3.2 (*)    Creatinine, Ser 1.90 (*)    Glucose, Bld 111 (*)    All other components within normal limits  SARS CORONAVIRUS 2 BY RT PCR (HOSPITAL ORDER, [place]  [facility])  PROTIME-INR  APTT  RAPID URINE DRUG SCREEN, HOSP PERFORMED  URINALYSIS, ROUTINE W REFLEX MICROSCOPIC  I-STAT CHEM 8, ED    EKG None  Radiology CT Head Wo Contrast  Result Date: 03/15/2020 CLINICAL DATA:  Initial code stroke, started tPA, then new headache EXAM: CT HEAD WITHOUT CONTRAST TECHNIQUE: Contiguous axial images were obtained from the base of the skull through the vertex without intravenous contrast. COMPARISON:  CT same day 9 p.m. FINDINGS: Brain: No evidence of acute territorial infarction, hemorrhage, hydrocephalus,extra-axial collection or mass lesion/mass effect. There is dilatation the ventricles and sulci consistent with age-related atrophy. Low-attenuation changes in the deep white matter consistent with small vessel ischemia. Vascular: No hyperdense vessel or unexpected calcification. Skull: The skull is intact. No fracture or focal lesion identified. Sinuses/Orbits: The visualized paranasal sinuses and mastoid air cells are clear. The orbits and globes intact. Other: None IMPRESSION: No acute intracranial abnormality, no significant change since the prior exam. There is dilatation the ventricles and sulci consistent with age-related atrophy. Low-attenuation changes in the deep white matter consistent with small vessel ischemia. Fresh in These results were called by telephone at the time of interpretation on 03/15/2020 at 10:13 pm to provider [facility] Sosie Gato , who verbally acknowledged these results.  Electronically Signed   By: Prudencio Pair M.D.   On: 03/15/2020 22:21   CT HEAD CODE STROKE WO CONTRAST  Result Date: 03/15/2020 CLINICAL DATA:  Code stroke. Initial evaluation for unsteady gait, bilateral facial weakness, dizziness. EXAM: CT HEAD WITHOUT CONTRAST TECHNIQUE: Contiguous axial images were obtained from the base of the skull through the vertex without intravenous contrast. COMPARISON:  Prior head CT from 10/18/2003. FINDINGS: Brain: Generalized age-related cerebral atrophy with mild to moderate chronic microvascular ischemic disease. No acute intracranial hemorrhage. No acute large vessel territory infarct. No mass lesion, midline shift or mass effect. No hydrocephalus or extra-axial fluid collection. Vascular: No hyperdense vessel. Scattered vascular calcifications noted within the carotid siphons. Skull: Scalp soft tissues and calvarium within normal limits. Sinuses/Orbits: Globes and orbital soft tissues within normal limits. Visualized paranasal sinuses are clear. No mastoid effusion. Other: None. ASPECTS [facility] Stroke Program Early CT Score) - Ganglionic level infarction (caudate, lentiform nuclei, internal capsule, insula, M1-M3 cortex): 7 - Supraganglionic infarction (M4-M6 cortex): 3 Total score (0-10 with 10 being normal): 10 IMPRESSION: 1. No acute intracranial infarct or other abnormality. 2. ASPECTS is 10. 3. Age-related cerebral atrophy with mild-to-moderate chronic microvascular ischemic disease. Critical Value/emergent results were called by telephone at the time of interpretation on 03/15/2020 at 9:27 pm to provider Curley Hogen , who verbally acknowledged these results. Electronically Signed   By: Jeannine Boga M.D.   On: 03/15/2020 21:28    Procedures Procedures (including critical care time)  Medications Ordered in ED Medications  alteplase (ACTIVASE) 1 mg/mL infusion 83.3 mg (0 mg/kg  92.5 kg Intravenous Stopped 03/15/20 2311)    Followed by  0.9 %  sodium  chloride infusion (50 mLs Intravenous New Bag/Given 03/15/20 2312)    ED Course  I have reviewed the triage vital signs and the nursing notes.  Pertinent labs & imaging results that were available during my care of the patient were reviewed by me and considered in my medical decision making (see chart for details).    CRITICAL CARE Performed by: Milton Ferguson Total critical care time: 35 minutes Critical care time was exclusive of separately billable procedures and treating other patients. Critical care was necessary to treat or prevent imminent  or life-threatening deterioration. Critical care was time spent personally by me on the following activities: development of treatment plan with patient and/or surrogate as well as nursing, discussions with consultants, evaluation of patient's response to treatment, examination of patient, obtaining history from patient or surrogate, ordering and performing treatments and interventions, ordering and review of laboratory studies, ordering and review of radiographic studies, pulse oximetry and re-evaluation of patient's condition. Patient seen by telemetry neurology who started TPA for the weakness of the left hand.  The patient started of a headache so the TPA was stopped and a another CT of the head was done.  The CT of the head also was negative so the TPA was started back up.  I spoke with Dr. Cheral Marker and Zacarias Pontes and he agreed the patient should come to [facility] but there were no beds available so we will try to admit the patient to any pain but the hospitalist did not feel comfortable taking care of the patient.  The patient will be transferred to the ED over at [facility] and Dr. Cheral Marker will see the patient there MDM Rules/Calculators/A&P                          Code stroke given TPA.  Patient's minimal weakness in his left hand seem to have improved with TPA         This patient presents to the ED for concern of dizziness this involves  an extensive number of treatment options, and is a complaint that carries with it a high risk of complications and morbidity.  The differential diagnosis includes stroke dehydration   Lab Tests:   I Ordered, reviewed, and interpreted labs, which included CBC chemistries EtOH.  Patient had a hypokalemia and elevated alcohol level  Medicines ordered:   I ordered medication TPA for stroke  Imaging Studies ordered:   I ordered imaging studies which included CT head and  I independently visualized and interpreted imaging which showed no acute disease  Additional history obtained:   Additional history obtained from family member  Previous records obtained and reviewed   Consultations Obtained:   I consulted neurology and hospitalist and discussed lab and imaging findings  Reevaluation:  After the interventions stated above, I reevaluated the patient and found minimally improved  Critical Interventions:  .   Final Clinical Impression(s) / ED Diagnoses Final diagnoses:  None    Rx / DC Orders ED Discharge Orders    None       Milton Ferguson, MD 03/15/20 2325

## 2020-03-15 NOTE — ED Notes (Signed)
tPA stopped at 2158 due to pt c/o headache that was not present before tPA administration.

## 2020-03-15 NOTE — ED Provider Notes (Signed)
Before the patient left to go to the ED Carl Garza called and said the patient had a bed available so the patient was put in as a direct admission to the ICU under Dr. Cheral Marker.  Dr. Cheral Marker should be notified when the patient gets there   Milton Ferguson, MD 03/15/20 2352

## 2020-03-15 NOTE — ED Triage Notes (Signed)
Pt c/o sudden onset of dizziness and facial drooping that started thirty minutes prior to arrival, Dr Roderic Palau at bedside, facial drooping noted to left side, grips weak on left side, , speech clear,

## 2020-03-16 ENCOUNTER — Inpatient Hospital Stay (HOSPITAL_COMMUNITY): Payer: 59

## 2020-03-16 DIAGNOSIS — I639 Cerebral infarction, unspecified: Secondary | ICD-10-CM

## 2020-03-16 DIAGNOSIS — I6389 Other cerebral infarction: Secondary | ICD-10-CM

## 2020-03-16 LAB — LIPID PANEL
Cholesterol: 174 mg/dL (ref 0–200)
HDL: 30 mg/dL — ABNORMAL LOW (ref >40)
LDL Cholesterol: 119 mg/dL — ABNORMAL HIGH (ref 0–99)
Total CHOL/HDL Ratio: 5.8 RATIO
Triglycerides: 125 mg/dL (ref <150)
VLDL: 25 mg/dL (ref 0–40)

## 2020-03-16 LAB — RAPID URINE DRUG SCREEN, HOSP PERFORMED
Amphetamines: NOT DETECTED
Barbiturates: NOT DETECTED
Benzodiazepines: NOT DETECTED
Cocaine: NOT DETECTED
Opiates: NOT DETECTED
Tetrahydrocannabinol: NOT DETECTED

## 2020-03-16 LAB — URINALYSIS, ROUTINE W REFLEX MICROSCOPIC
Bilirubin Urine: NEGATIVE
Glucose, UA: NEGATIVE mg/dL
Hgb urine dipstick: NEGATIVE
Ketones, ur: NEGATIVE mg/dL
Leukocytes,Ua: NEGATIVE
Nitrite: NEGATIVE
Protein, ur: NEGATIVE mg/dL
Specific Gravity, Urine: 1.01 (ref 1.005–1.030)
pH: 5 (ref 5.0–8.0)

## 2020-03-16 LAB — ECHOCARDIOGRAM COMPLETE
Height: 68 in
Weight: 3264 oz

## 2020-03-16 LAB — MRSA PCR SCREENING: MRSA by PCR: NEGATIVE

## 2020-03-16 LAB — HEMOGLOBIN A1C
Hgb A1c MFr Bld: 5.8 % — ABNORMAL HIGH (ref 4.8–5.6)
Mean Plasma Glucose: 120 mg/dL

## 2020-03-16 LAB — HIV ANTIBODY (ROUTINE TESTING W REFLEX): HIV Screen 4th Generation wRfx: NONREACTIVE

## 2020-03-16 MED ORDER — EZETIMIBE 10 MG PO TABS
10.0000 mg | ORAL_TABLET | Freq: Every morning | ORAL | Status: DC
  Administered 2020-03-16 – 2020-03-17 (×2): 10 mg via ORAL
  Filled 2020-03-16 (×2): qty 1

## 2020-03-16 MED ORDER — CHLORHEXIDINE GLUCONATE CLOTH 2 % EX PADS
6.0000 | MEDICATED_PAD | Freq: Every day | CUTANEOUS | Status: DC
  Administered 2020-03-16: 6 via TOPICAL

## 2020-03-16 MED ORDER — PANTOPRAZOLE SODIUM 40 MG IV SOLR
40.0000 mg | Freq: Every day | INTRAVENOUS | Status: DC
  Administered 2020-03-16: 40 mg via INTRAVENOUS
  Filled 2020-03-16: qty 40

## 2020-03-16 MED ORDER — IRBESARTAN 150 MG PO TABS
150.0000 mg | ORAL_TABLET | Freq: Every day | ORAL | Status: DC
  Administered 2020-03-16 – 2020-03-17 (×2): 150 mg via ORAL
  Filled 2020-03-16 (×2): qty 1

## 2020-03-16 MED ORDER — PERFLUTREN LIPID MICROSPHERE
1.0000 mL | INTRAVENOUS | Status: AC | PRN
  Administered 2020-03-16: 2 mL via INTRAVENOUS
  Filled 2020-03-16: qty 10

## 2020-03-16 MED ORDER — ATORVASTATIN CALCIUM 80 MG PO TABS
80.0000 mg | ORAL_TABLET | Freq: Every day | ORAL | Status: DC
  Administered 2020-03-16: 80 mg via ORAL
  Filled 2020-03-16 (×2): qty 1

## 2020-03-16 MED ORDER — VALSARTAN-HYDROCHLOROTHIAZIDE 160-25 MG PO TABS: 1.0000 | ORAL_TABLET | Freq: Every morning | ORAL | Status: DC

## 2020-03-16 MED ORDER — POTASSIUM CHLORIDE CRYS ER 20 MEQ PO TBCR
20.0000 meq | EXTENDED_RELEASE_TABLET | Freq: Every morning | ORAL | Status: DC
  Administered 2020-03-16 – 2020-03-17 (×2): 20 meq via ORAL
  Filled 2020-03-16 (×2): qty 1

## 2020-03-16 MED ORDER — ACETAMINOPHEN 325 MG PO TABS
650.0000 mg | ORAL_TABLET | ORAL | Status: DC | PRN
  Administered 2020-03-16 – 2020-03-17 (×2): 650 mg via ORAL
  Filled 2020-03-16 (×2): qty 2

## 2020-03-16 MED ORDER — HYDROCHLOROTHIAZIDE 25 MG PO TABS
25.0000 mg | ORAL_TABLET | Freq: Every day | ORAL | Status: DC
  Administered 2020-03-16 – 2020-03-17 (×2): 25 mg via ORAL
  Filled 2020-03-16 (×2): qty 1

## 2020-03-16 MED ORDER — CLEVIDIPINE BUTYRATE 0.5 MG/ML IV EMUL: 0.0000 mg/h | INTRAVENOUS | Status: DC

## 2020-03-16 MED ORDER — GABAPENTIN 300 MG PO CAPS
300.0000 mg | ORAL_CAPSULE | Freq: Three times a day (TID) | ORAL | Status: DC
  Administered 2020-03-16 – 2020-03-17 (×4): 300 mg via ORAL
  Filled 2020-03-16 (×4): qty 1

## 2020-03-16 MED ORDER — IOHEXOL 350 MG/ML SOLN
100.0000 mL | Freq: Once | INTRAVENOUS | Status: AC | PRN
  Administered 2020-03-16: 100 mL via INTRAVENOUS

## 2020-03-16 MED ORDER — SODIUM CHLORIDE 0.9 % IV SOLN: INTRAVENOUS | Status: AC

## 2020-03-16 MED ORDER — SENNOSIDES-DOCUSATE SODIUM 8.6-50 MG PO TABS: 1.0000 | ORAL_TABLET | Freq: Every evening | ORAL | Status: DC | PRN

## 2020-03-16 MED ORDER — STROKE: EARLY STAGES OF RECOVERY BOOK
Freq: Once | Status: DC
  Filled 2020-03-16: qty 1

## 2020-03-16 MED ORDER — ACETAMINOPHEN 160 MG/5ML PO SOLN: 650.0000 mg | ORAL | Status: DC | PRN

## 2020-03-16 MED ORDER — ACETAMINOPHEN 650 MG RE SUPP: 650.0000 mg | RECTAL | Status: DC | PRN

## 2020-03-16 MED ORDER — AMLODIPINE BESYLATE 10 MG PO TABS
10.0000 mg | ORAL_TABLET | Freq: Every evening | ORAL | Status: DC
  Administered 2020-03-16: 10 mg via ORAL
  Filled 2020-03-16: qty 1

## 2020-03-16 MED ORDER — PNEUMOCOCCAL VAC POLYVALENT 25 MCG/0.5ML IJ INJ
0.5000 mL | INJECTION | INTRAMUSCULAR | Status: AC
  Administered 2020-03-17: 0.5 mL via INTRAMUSCULAR
  Filled 2020-03-16: qty 0.5

## 2020-03-16 NOTE — Evaluation (Signed)
Physical Therapy Evaluation Patient Details Name: Carl Garza MRN: [id number] DOB: Jun 30, 1956 Today's Date: 03/16/2020   History of Present Illness  Carl Garza is an 64 y.o. male with a PMHx of HTN, HLD, chronic back pain, cervical DDD and sciatica, who initially presented to the [facility] ED with chief complaints of sudden onset dizziness, left facial droop and LUE weakness. NIHSS was 1. Pt received tPA. imaging revealed R MCA stroke. Pt also with R lateral ankle pain s/p fall.    Clinical Impression  Pt admitted with above. Pt c/o R lateral ankle pain. Pt with mild lateral R ankle swelling, gave pt ice pack. Pt ambulation tolerance limited by R ankle pain and now requires RW for safe ambulation due to limited R LE WBing tolerance due to pain. SUspect pt will progress well. Acute PT to cont to follow and progress amb and stair negotiation.    Follow Up Recommendations Home health PT;Supervision - Intermittent    Equipment Recommendations  None recommended by PT (pt has RW at home)    Recommendations for Other Services       Precautions / Restrictions Precautions Precautions: Fall Precaution Comments: R ankle pain Restrictions Weight Bearing Restrictions: No      Mobility  Bed Mobility Overal bed mobility: Modified Independent             General bed mobility comments: HOB elevated, pulled self up into sitting, mildly labored, increased time but no physical assist  Transfers Overall transfer level: Needs assistance Equipment used: Rolling walker (2 wheeled) Transfers: Sit to/from Stand Sit to Stand: Min guard         General transfer comment: verbal cues for safe hand placement, no physical assist  Ambulation/Gait Ambulation/Gait assistance: Min guard Gait Distance (Feet): 10 Feet (x2, to/from bathroom) Assistive device: Rolling walker (2 wheeled) Gait Pattern/deviations: Step-to pattern;Decreased stride length Gait velocity: slow Gait velocity  interpretation: <1.31 ft/sec, indicative of household ambulator General Gait Details: pt with R ankle pain causing pt to increased bilat UE support to offweight R LE, pt with antalgic gait and step to gait pattern  Stairs            Wheelchair Mobility    Modified Rankin (Stroke Patients Only) Modified Rankin (Stroke Patients Only) Pre-Morbid Rankin Score: No symptoms Modified Rankin: Moderate disability     Balance Overall balance assessment: Mild deficits observed, not formally tested (pt needs RW for safe ambulation at this time)                                           Pertinent Vitals/Pain Pain Assessment: 0-10 Pain Score: 6  Pain Location: R lateral ankle Pain Descriptors / Indicators: Sore (point tenderness over lateral ligments and lateral malleoli) Pain Intervention(s): Ice applied    Home Living Family/patient expects to be discharged to:: Private residence Living Arrangements: Spouse/significant other;Children Available Help at Discharge: Family;Available PRN/intermittently Type of Home: House Home Access: Stairs to enter Entrance Stairs-Rails: Left Entrance Stairs-Number of Steps: 5 Home Layout: One level Home Equipment: Walker - 2 wheels;Cane - single point Additional Comments: pt sedentary at home    Prior Function Level of Independence: Independent         Comments: pt retired, Civil engineer, contracting and smokes, doesn't do much at [place]: Right    Extremity/Trunk Assessment  Upper Extremity Assessment Upper Extremity Assessment: Defer to OT evaluation    Lower Extremity Assessment Lower Extremity Assessment: RLE deficits/detail RLE Deficits / Details: R ankle with limited ROM due to onset of pain, pt reports more pain with plantar flexion than dorsiflexion    Cervical / Trunk Assessment Cervical / Trunk Assessment: Normal  Communication   Communication: No difficulties  Cognition  Arousal/Alertness: Awake/alert Behavior During Therapy: WFL for tasks assessed/performed Overall Cognitive Status: Impaired/Different from baseline Area of Impairment: Attention;Following commands;Problem solving                   Current Attention Level: Sustained   Following Commands: Follows multi-step commands with increased time     Problem Solving: Slow processing General Comments: pt appears near cognitive baseline but does demo some delayed response time      General Comments General comments (skin integrity, edema, etc.): VSS, mild R lateral ankle swelling    Exercises Other Exercises Other Exercises: instructed pt and spouse on R ankle ROM exercises within pain tolerance, PF, DF and circles   Assessment/Plan    PT Assessment Patient needs continued PT services  PT Problem List Decreased strength;Decreased range of motion;Decreased activity tolerance;Decreased balance;Decreased mobility;Decreased knowledge of use of DME       PT Treatment Interventions DME instruction;Gait training;Stair training;Functional mobility training;Therapeutic activities;Therapeutic exercise;Balance training;Neuromuscular re-education    PT Goals (Current goals can be found in the Care Plan section)  Acute Rehab PT Goals Patient Stated Goal: home PT Goal Formulation: With patient/family Time For Goal Achievement: 03/30/20 Potential to Achieve Goals: Good    Frequency Min 4X/week   Barriers to discharge Decreased caregiver support will be alone during the day so must be mod I function    Co-evaluation               AM-PAC PT "6 Clicks" Mobility  Outcome Measure Help needed turning from your back to your side while in a flat bed without using bedrails?: None Help needed moving from lying on your back to sitting on the side of a flat bed without using bedrails?: None Help needed moving to and from a bed to a chair (including a wheelchair)?: None Help needed standing up  from a chair using your arms (e.g., wheelchair or bedside chair)?: A Little Help needed to walk in hospital room?: A Little Help needed climbing 3-5 steps with a railing? : A Little 6 Click Score: 21    End of Session Equipment Utilized During Treatment: Gait belt Activity Tolerance: Patient limited by pain Patient left: in chair;with call bell/phone within reach;with chair alarm set;with nursing/sitter in room;with family/visitor present Nurse Communication: Mobility status PT Visit Diagnosis: Unsteadiness on feet (R26.81);Muscle weakness (generalized) (M62.81);Difficulty in walking, not elsewhere classified (R26.2)    Time: 9935-7017 PT Time Calculation (min) (ACUTE ONLY): 13 min   Charges:   PT Evaluation $PT Eval Moderate Complexity: 1 Mod          Kittie Plater, PT, DPT Acute Rehabilitation Services Pager #: [phone] Office #: [phone]   Berline Lopes 03/16/2020, 12:47 PM

## 2020-03-16 NOTE — Evaluation (Signed)
Occupational Therapy Evaluation Patient Details Name: Carl Garza MRN: [id number] DOB: 1956-06-10 Today's Date: 03/16/2020    History of Present Illness Carl Garza is an 64 y.o. male with a PMHx of HTN, HLD, chronic back pain, cervical DDD and sciatica, who initially presented to the [facility] ED with chief complaints of sudden onset dizziness, left facial droop and LUE weakness. NIHSS was 1. Pt received tPA. imaging revealed R MCA stroke. Pt also with R lateral ankle pain s/p fall.   Clinical Impression   Pt admitted with above. He demonstrates the below listed deficits and will benefit from continued OT to maximize safety and independence with BADLs.  Pt presents to OT with ankle pain, mild balance deficits, decreased activity tolerance, and impaired cognition.  Pt requires min guard assist for ADLs and functional mobility.  He lives with his wife, and was mod I with ADLs and functional mobility.  Recommend HHOT at discharge for higher level cognition and safety.  Will follow acutely.       Follow Up Recommendations  Home health OT;Supervision - Intermittent    Equipment Recommendations  Tub/shower bench    Recommendations for Other Services       Precautions / Restrictions Precautions Precautions: Fall Precaution Comments: R ankle pain Restrictions Weight Bearing Restrictions: No      Mobility Bed Mobility Overal bed mobility: Modified Independent             General bed mobility comments: HOB elevated, pulled self up into sitting, mildly labored, increased time but no physical assist  Transfers Overall transfer level: Needs assistance Equipment used: Rolling walker (2 wheeled) Transfers: Sit to/from Stand Sit to Stand: Min guard         General transfer comment: verbal cues for safe hand placement, no physical assist.  1 LOB noted, but pt was able to recover without physical assist     Balance Overall balance assessment: Mild deficits observed, not  formally tested (pt needs RW for safe ambulation at this time)                                         ADL either performed or assessed with clinical judgement   ADL Overall ADL's : Needs assistance/impaired Eating/Feeding: Independent   Grooming: Wash/dry hands;Wash/dry face;Oral care;Brushing hair;Min guard;Standing   Upper Body Bathing: Set up;Sitting   Lower Body Bathing: Min guard;Sit to/from stand   Upper Body Dressing : Set up;Sitting   Lower Body Dressing: Min guard;Sit to/from stand   Toilet Transfer: Min guard;Ambulation;Comfort height toilet   Toileting- Clothing Manipulation and Hygiene: Min guard;Sit to/from stand       Functional mobility during ADLs: Min guard;Rolling walker       Vision Baseline Vision/History:  (Pt reports he needs glasses ) Patient Visual Report: No change from baseline Vision Assessment?: Yes Eye Alignment: Within Functional Limits Ocular Range of Motion: Within Functional Limits Alignment/Gaze Preference: Within Defined Limits Tracking/Visual Pursuits: Able to track stimulus in all quads without difficulty Convergence: Within functional limits Visual Fields: No apparent deficits     Agricultural engineer Tested?: Yes   Praxis Praxis Praxis tested?: Within functional limits    Pertinent Vitals/Pain Pain Assessment: 0-10 Pain Score: 6  Pain Location: R lateral ankle Pain Descriptors / Indicators: Sore (point tenderness over lateral ligments and lateral malleoli) Pain Intervention(s): Repositioned;Ice applied;Limited activity within patient's tolerance  Hand Dominance Right   Extremity/Trunk Assessment Upper Extremity Assessment Upper Extremity Assessment: Overall WFL for tasks assessed   Lower Extremity Assessment Lower Extremity Assessment: Defer to PT evaluation RLE Deficits / Details: R ankle with limited ROM due to onset of pain, pt reports more pain with plantar flexion than  dorsiflexion   Cervical / Trunk Assessment Cervical / Trunk Assessment: Normal   Communication Communication Communication: No difficulties   Cognition Arousal/Alertness: Awake/alert Behavior During Therapy: WFL for tasks assessed/performed;Flat affect Overall Cognitive Status: Impaired/Different from baseline Area of Impairment: Attention;Following commands;Problem solving                   Current Attention Level: Sustained   Following Commands: Follows multi-step commands with increased time;Follows multi-step commands inconsistently     Problem Solving: Slow processing General Comments: Pt demonstrates slow processing, verbal cues for problem solving.  Wife confirms that pt is not at baseline    General Comments  VSS, mild R lateral ankle swelling    Exercises Exercises: Other exercises Other Exercises Other Exercises: instructed pt and spouse on R ankle ROM exercises within pain tolerance, PF, DF and circles   Shoulder Instructions      Home Living Family/patient expects to be discharged to:: Private residence Living Arrangements: Spouse/significant other;Children Available Help at Discharge: Family;Available PRN/intermittently Type of Home: House Home Access: Stairs to enter [employer] of Steps: 5 Entrance Stairs-Rails: Left Home Layout: One level     Bathroom Shower/Tub: Teacher, early years/pre: Standard     Home Equipment: Environmental consultant - 2 wheels;Cane - single point   Additional Comments: pt sedentary at home  Lives With: Spouse    Prior Functioning/Environment Level of Independence: Independent        Comments: Wife manages pt medications and finances. 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         OT Problem List: Decreased activity tolerance;Impaired balance (sitting and/or standing);Decreased cognition;Decreased safety awareness;Pain      OT Treatment/Interventions: Self-care/ADL  training;DME and/or AE instruction;Therapeutic activities;Cognitive remediation/compensation;Patient/family education;Balance training    OT Goals(Current goals can be found in the care plan section) Acute Rehab OT Goals Patient Stated Goal: home OT Goal Formulation: With patient/family Time For Goal Achievement: 03/30/20 Potential to Achieve Goals: Good ADL Goals Pt Will Perform Grooming: with supervision;standing Pt Will Perform Lower Body Bathing: with supervision;sit to/from stand Pt Will Perform Lower Body Dressing: with supervision;sit to/from stand Pt Will Transfer to Toilet: with supervision;ambulating;regular height toilet Pt Will Perform Toileting - Clothing Manipulation and hygiene: with supervision;sit to/from stand Pt Will Perform Tub/Shower Transfer: Tub transfer;tub bench;rolling walker;with supervision  OT Frequency: Min 2X/week   Barriers to D/C:            Co-evaluation              AM-PAC OT "6 Clicks" Daily Activity     Outcome Measure Help from another person eating meals?: None Help from another person taking care of personal grooming?: A Little Help from another person toileting, which includes using toliet, bedpan, or urinal?: A Little Help from another person bathing (including washing, rinsing, drying)?: A Little Help from another person to put on and taking off regular upper body clothing?: A Little Help from another person to put on and taking off regular lower body clothing?: A Little 6 Click Score: 19   End of Session Equipment Utilized During Treatment: Gait belt;Rolling walker Nurse Communication: Mobility status  Activity Tolerance: Patient tolerated treatment well Patient left: in chair;with call bell/phone within reach;with chair alarm set;with family/visitor present;with nursing/sitter in room  OT Visit Diagnosis: Unsteadiness on feet (R26.81);Cognitive communication deficit (R41.841) Symptoms and signs involving cognitive functions:  Cerebral infarction                Time: 4715-9539 OT Time Calculation (min): 22 min Charges:  OT General Charges $OT Visit: 1 Visit OT Evaluation $OT Eval Moderate Complexity: 1 Mod  Nilsa Nutting., OTR/L Acute Rehabilitation Services Pager [phone] Office [place], [place] 03/16/2020, 1:52 PM

## 2020-03-16 NOTE — Progress Notes (Signed)
  Echocardiogram 2D Echocardiogram has been performed.  Carl Garza 03/16/2020, 1:50 PM

## 2020-03-16 NOTE — Evaluation (Signed)
Speech Language Pathology Evaluation Patient Details Name: Carl Garza MRN: [id number] DOB: 18-Jun-1956 Today's Date: 03/16/2020 Time: 7564-3329 SLP Time Calculation (min) (ACUTE ONLY): 13 min  Problem List:  Patient Active Problem List   Diagnosis Date Noted  . Stroke (cerebrum) ([place]) 03/15/2020  . Acute appendicitis 11/04/2017  . Acute appendicitis, uncomplicated   . Chest pain 07/21/2017   Past Medical History:  Past Medical History:  Diagnosis Date  . Chronic back pain   . DDD (degenerative disc disease), cervical   . Hypertension   . Sciatica    Past Surgical History:  Past Surgical History:  Procedure Laterality Date  . COLONOSCOPY N/A 07/11/2018   Procedure: COLONOSCOPY;  Surgeon: Daneil Dolin, MD;  Location: [facility];  Service: Endoscopy;  Laterality: N/A;  9:30  . LAPAROSCOPIC APPENDECTOMY N/A 11/04/2017   Procedure: APPENDECTOMY LAPAROSCOPIC;  Surgeon: Aviva Signs, MD;  Location: [facility];  Service: General;  Laterality: N/A;  . POLYPECTOMY  07/11/2018   Procedure: POLYPECTOMY;  Surgeon: Daneil Dolin, MD;  Location: [facility];  Service: Endoscopy;;  sigmoid   HPI:   64 y.o. male with a PMHx of HTN, HLD, chronic back pain, cervical DDD and sciatica, who initially presented to the [facility] ED 03/15/20 with chief complaints of sudden onset dizziness, left facial droop and LUE weakness, concerning for right CVA. tPA was started and he was transferred to [facility] for post-tPA management and full stroke work up.   Assessment / Plan / Recommendation Clinical Impression  Pt presents with changes in cognition s/p acute neurological event; his wife reports differences in memory and processing.  During assessment, he demonstrated orientation x4, awareness of situation/circumstances was mildly impaired, naming/fluency/speech were intact as would be anticipated.  Verbal recall was impaired for word strings after five minute delay, with notable conflating of task items and  difficulty recalling sequence of presentation.  Pt demonstrated adequate sustained and selective attention. Clock drawing was impaired with numerical spacing favoring right side of image; pt was able to recognize mistakes and correct them by 50% in second attempt.  Recommend SLP f/u while in acute care to address functional recall in particular; he and wife agree with plan.     SLP Assessment  SLP Recommendation/Assessment: Patient needs continued Speech Lanaguage Pathology Services SLP Visit Diagnosis: Cognitive communication deficit (R41.841)    Follow Up Recommendations  24 hour supervision/assistance    Frequency and Duration min 2x/week  1 week      SLP Evaluation Cognition  Overall Cognitive Status: Impaired/Different from baseline Arousal/Alertness: Awake/alert Orientation Level: Oriented X4 Attention: Selective Selective Attention: Appears intact Memory: Impaired Memory Impairment: Retrieval deficit Awareness: Impaired Awareness Impairment: Anticipatory impairment Safety/Judgment: Impaired       Comprehension  Auditory Comprehension Overall Auditory Comprehension: Appears within functional limits for tasks assessed Visual Recognition/Discrimination Discrimination: Within Function Limits Reading Comprehension Reading Status: Within funtional limits    Expression Expression Primary Mode of Expression: Verbal Verbal Expression Overall Verbal Expression: Appears within functional limits for tasks assessed Written Expression Dominant Hand: Right Written Expression: Within Functional Limits   Oral / Motor  Oral Motor/Sensory Function Overall Oral Motor/Sensory Function: Within functional limits Motor Speech Overall Motor Speech: Appears within functional limits for tasks assessed   GO                    Carl Garza 03/16/2020, 11:22 AM   Carl Garza, Carl Garza Office number [phone] Pager [phone]

## 2020-03-16 NOTE — Progress Notes (Signed)
STROKE TEAM PROGRESS NOTE   INTERVAL HISTORY I personally reviewed history of presenting illness with the patient, electronic medical records and imaging films in PACS.  He presented with left hemiparesis and received IV TPA and is obtained improvement.  Blood pressure tightly controlled.  Post TPA brain imaging is pending.  Vital signs are stable.  Vitals:   03/16/20 0515 03/16/20 0530 03/16/20 0545 03/16/20 0600  BP: 118/89 117/87 123/85 112/89  Pulse: (!) 102 (!) 103 99 (!) 103  Resp: 16 14 18 16   Temp:      TempSrc:      SpO2: 94% 96% 93% 96%  Weight:      Height:        CBC:  Recent Labs  Lab 03/15/20 2117 03/15/20 2129  WBC 11.5*  --   NEUTROABS 7.3  --   HGB 12.9* 13.3  HCT 39.1 39.0  MCV 91.6  --   PLT 321  --     Basic Metabolic Panel:  Recent Labs  Lab 03/15/20 2117 03/15/20 2129  NA 141 142  K 3.3* 3.2*  CL 106 104  CO2 24  --   GLUCOSE 112* 111*  BUN 20 19  CREATININE 1.64* 1.90*  CALCIUM 9.2  --    Lipid Panel:     Component Value Date/Time   CHOL 174 03/16/2020 0506   TRIG 125 03/16/2020 0506   HDL 30 (L) 03/16/2020 0506   CHOLHDL 5.8 03/16/2020 0506   VLDL 25 03/16/2020 0506   LDLCALC 119 (H) 03/16/2020 0506   HgbA1c: No results found for: HGBA1C Urine Drug Screen:     Component Value Date/Time   LABOPIA NONE DETECTED 03/16/2020 0008   COCAINSCRNUR NONE DETECTED 03/16/2020 0008   LABBENZ NONE DETECTED 03/16/2020 0008   AMPHETMU NONE DETECTED 03/16/2020 0008   THCU NONE DETECTED 03/16/2020 0008   LABBARB NONE DETECTED 03/16/2020 0008    Alcohol Level     Component Value Date/Time   ETH 101 (H) 03/15/2020 2117    IMAGING past 24 hours CT ANGIO HEAD W OR WO CONTRAST  Result Date: 03/16/2020 CLINICAL DATA:  Follow-up examination for acute stroke. EXAM: CT ANGIOGRAPHY HEAD AND NECK TECHNIQUE: Multidetector CT imaging of the head and neck was performed using the standard protocol during bolus administration of intravenous contrast.  Multiplanar CT image reconstructions and MIPs were obtained to evaluate the vascular anatomy. Carotid stenosis measurements (when applicable) are obtained utilizing NASCET criteria, using the distal internal carotid diameter as the denominator. CONTRAST:  170mL OMNIPAQUE IOHEXOL 350 MG/ML SOLN COMPARISON:  Prior head CT from 03/15/2020. FINDINGS: CTA NECK FINDINGS Aortic arch: Visualized ascending aorta of dilated up to approximately 4 cm. Normal branch pattern seen at the aortic arch. Moderate atheromatous change about the origin of the great vessels without hemodynamically significant stenosis. Right carotid system: Right CCA patent from its origin to the bifurcation without stenosis. Mild scattered plaque about the right bifurcation without hemodynamically significant stenosis. Right ICA widely patent distally to the skull base without stenosis, dissection or occlusion. Left carotid system: Left CCA tortuous proximally but widely patent to the bifurcation without stenosis. Scattered calcified plaque about the origin of the left ICA without high-grade stenosis. Left ICA widely patent distally to the skull base without stenosis, dissection or occlusion. Vertebral arteries: Both vertebral arteries arise from the subclavian arteries. No significant proximal subclavian artery stenosis. Atheromatous plaque at the origins and proximal V1 segments bilaterally with associated mild multifocal stenoses. Vertebral arteries otherwise widely patent  without stenosis, dissection or occlusion. Skeleton: No acute osseous abnormality. No discrete or worrisome osseous lesions. Moderate multilevel cervical spondylosis, most pronounced at C4-5. Other neck: No other acute soft tissue abnormality within the neck. No mass lesion or adenopathy. Upper chest: Visualized upper chest demonstrates no acute finding. Review of the MIP images confirms the above findings CTA HEAD FINDINGS Anterior circulation: Petrous segments widely patent  bilaterally. Extensive calcified plaque throughout the carotid siphons with associated moderate right worse than left multifocal stenoses. ICA termini well perfused. Left A1 patent. Right A1 hypoplastic and/or absent, accounting for the diminutive right ICA is compared to the left. Normal anterior communicating artery complex. Short-segment severe proximal right A2 stenosis (series 7, image 106). Additional short-segment severe left A2 stenosis seen slightly distally (series 7, image 87). Additional moderate to severe stenoses noted distally within the ACAs, which remain perfused to their distal aspects. Right M1 irregular but remains patent without stenosis. Normal right MCA bifurcation. On the left, there is a severe stenosis involving the proximal left M1 segment (series 10, image 22). Stenosis measures approximately 5 mm in length. Left M1 patent distally. Normal left MCA bifurcation. Extensive atheromatous change throughout the MCA branches bilaterally with associated moderate to severe multifocal segmental stenoses, advanced in nature. Posterior circulation: Both vertebral arteries widely patent to the vertebrobasilar junction. Left vertebral artery dominant. Left PICA patent at its origin. Right PICA not seen. Basilar widely patent to its distal aspect without stenosis. Superior cerebral arteries patent bilaterally. Both PCAs primarily supplied via the basilar. Multifocal moderate to severe bilateral P2 stenoses, right worse than left. PCAs remain perfused to their distal aspects, although demonstrate distal small vessel atheromatous irregularity. Venous sinuses: Grossly patent allowing for timing the contrast bolus. Anatomic variants: Hypoplastic/absent right A1 segment. No intracranial aneurysm. Review of the MIP images confirms the above findings IMPRESSION: 1. Negative CTA for large vessel occlusion. 2. Short-segment severe proximal left M1 stenosis, with additional severe intracranial atherosclerotic  disease as above. Notable findings include moderate to severe bilateral A2 and P2 stenoses, with extensive moderate to severe multifocal stenoses throughout the distal MCA branches. 3. Extensive calcified plaque throughout the carotid siphons with associated moderate right worse than left multifocal stenoses. 4. Mild-to-moderate atheromatous change about the origins of the great vessels and carotid bifurcations without flow-limiting stenosis. 5. Dilatation of the ascending aorta up to 4 cm in diameter. Recommend annual imaging followup by CTA or MRA. This recommendation follows 2010 ACCF/AHA/AATS/ACR/ASA/SCA/SCAI/SIR/STS/SVM Guidelines for the Diagnosis and Management of Patients with Thoracic Aortic Disease. Circulation. 2010; 121: G387-F643. Aortic aneurysm NOS (ICD10-I71.9) Electronically Signed   By: Jeannine Boga M.D.   On: 03/16/2020 04:56   CT Head Wo Contrast  Result Date: 03/15/2020 CLINICAL DATA:  Initial code stroke, started tPA, then new headache EXAM: CT HEAD WITHOUT CONTRAST TECHNIQUE: Contiguous axial images were obtained from the base of the skull through the vertex without intravenous contrast. COMPARISON:  CT same day 9 p.m. FINDINGS: Brain: No evidence of acute territorial infarction, hemorrhage, hydrocephalus,extra-axial collection or mass lesion/mass effect. There is dilatation the ventricles and sulci consistent with age-related atrophy. Low-attenuation changes in the deep white matter consistent with small vessel ischemia. Vascular: No hyperdense vessel or unexpected calcification. Skull: The skull is intact. No fracture or focal lesion identified. Sinuses/Orbits: The visualized paranasal sinuses and mastoid air cells are clear. The orbits and globes intact. Other: None IMPRESSION: No acute intracranial abnormality, no significant change since the prior exam. There is dilatation the ventricles and sulci  consistent with age-related atrophy. Low-attenuation changes in the deep white  matter consistent with small vessel ischemia. Fresh in These results were called by telephone at the time of interpretation on 03/15/2020 at 10:13 pm to provider [facility] ZAMMIT , who verbally acknowledged these results. Electronically Signed   By: Prudencio Pair M.D.   On: 03/15/2020 22:21   CT ANGIO NECK W OR WO CONTRAST  Result Date: 03/16/2020 CLINICAL DATA:  Follow-up examination for acute stroke. EXAM: CT ANGIOGRAPHY HEAD AND NECK TECHNIQUE: Multidetector CT imaging of the head and neck was performed using the standard protocol during bolus administration of intravenous contrast. Multiplanar CT image reconstructions and MIPs were obtained to evaluate the vascular anatomy. Carotid stenosis measurements (when applicable) are obtained utilizing NASCET criteria, using the distal internal carotid diameter as the denominator. CONTRAST:  18mL OMNIPAQUE IOHEXOL 350 MG/ML SOLN COMPARISON:  Prior head CT from 03/15/2020. FINDINGS: CTA NECK FINDINGS Aortic arch: Visualized ascending aorta of dilated up to approximately 4 cm. Normal branch pattern seen at the aortic arch. Moderate atheromatous change about the origin of the great vessels without hemodynamically significant stenosis. Right carotid system: Right CCA patent from its origin to the bifurcation without stenosis. Mild scattered plaque about the right bifurcation without hemodynamically significant stenosis. Right ICA widely patent distally to the skull base without stenosis, dissection or occlusion. Left carotid system: Left CCA tortuous proximally but widely patent to the bifurcation without stenosis. Scattered calcified plaque about the origin of the left ICA without high-grade stenosis. Left ICA widely patent distally to the skull base without stenosis, dissection or occlusion. Vertebral arteries: Both vertebral arteries arise from the subclavian arteries. No significant proximal subclavian artery stenosis. Atheromatous plaque at the origins and proximal V1  segments bilaterally with associated mild multifocal stenoses. Vertebral arteries otherwise widely patent without stenosis, dissection or occlusion. Skeleton: No acute osseous abnormality. No discrete or worrisome osseous lesions. Moderate multilevel cervical spondylosis, most pronounced at C4-5. Other neck: No other acute soft tissue abnormality within the neck. No mass lesion or adenopathy. Upper chest: Visualized upper chest demonstrates no acute finding. Review of the MIP images confirms the above findings CTA HEAD FINDINGS Anterior circulation: Petrous segments widely patent bilaterally. Extensive calcified plaque throughout the carotid siphons with associated moderate right worse than left multifocal stenoses. ICA termini well perfused. Left A1 patent. Right A1 hypoplastic and/or absent, accounting for the diminutive right ICA is compared to the left. Normal anterior communicating artery complex. Short-segment severe proximal right A2 stenosis (series 7, image 106). Additional short-segment severe left A2 stenosis seen slightly distally (series 7, image 87). Additional moderate to severe stenoses noted distally within the ACAs, which remain perfused to their distal aspects. Right M1 irregular but remains patent without stenosis. Normal right MCA bifurcation. On the left, there is a severe stenosis involving the proximal left M1 segment (series 10, image 22). Stenosis measures approximately 5 mm in length. Left M1 patent distally. Normal left MCA bifurcation. Extensive atheromatous change throughout the MCA branches bilaterally with associated moderate to severe multifocal segmental stenoses, advanced in nature. Posterior circulation: Both vertebral arteries widely patent to the vertebrobasilar junction. Left vertebral artery dominant. Left PICA patent at its origin. Right PICA not seen. Basilar widely patent to its distal aspect without stenosis. Superior cerebral arteries patent bilaterally. Both PCAs  primarily supplied via the basilar. Multifocal moderate to severe bilateral P2 stenoses, right worse than left. PCAs remain perfused to their distal aspects, although demonstrate distal small vessel atheromatous irregularity. Venous sinuses:  Grossly patent allowing for timing the contrast bolus. Anatomic variants: Hypoplastic/absent right A1 segment. No intracranial aneurysm. Review of the MIP images confirms the above findings IMPRESSION: 1. Negative CTA for large vessel occlusion. 2. Short-segment severe proximal left M1 stenosis, with additional severe intracranial atherosclerotic disease as above. Notable findings include moderate to severe bilateral A2 and P2 stenoses, with extensive moderate to severe multifocal stenoses throughout the distal MCA branches. 3. Extensive calcified plaque throughout the carotid siphons with associated moderate right worse than left multifocal stenoses. 4. Mild-to-moderate atheromatous change about the origins of the great vessels and carotid bifurcations without flow-limiting stenosis. 5. Dilatation of the ascending aorta up to 4 cm in diameter. Recommend annual imaging followup by CTA or MRA. This recommendation follows 2010 ACCF/AHA/AATS/ACR/ASA/SCA/SCAI/SIR/STS/SVM Guidelines for the Diagnosis and Management of Patients with Thoracic Aortic Disease. Circulation. 2010; 121: B341-P379. Aortic aneurysm NOS (ICD10-I71.9) Electronically Signed   By: Jeannine Boga M.D.   On: 03/16/2020 04:56   CT HEAD CODE STROKE WO CONTRAST  Result Date: 03/15/2020 CLINICAL DATA:  Code stroke. Initial evaluation for unsteady gait, bilateral facial weakness, dizziness. EXAM: CT HEAD WITHOUT CONTRAST TECHNIQUE: Contiguous axial images were obtained from the base of the skull through the vertex without intravenous contrast. COMPARISON:  Prior head CT from 10/18/2003. FINDINGS: Brain: Generalized age-related cerebral atrophy with mild to moderate chronic microvascular ischemic disease. No  acute intracranial hemorrhage. No acute large vessel territory infarct. No mass lesion, midline shift or mass effect. No hydrocephalus or extra-axial fluid collection. Vascular: No hyperdense vessel. Scattered vascular calcifications noted within the carotid siphons. Skull: Scalp soft tissues and calvarium within normal limits. Sinuses/Orbits: Globes and orbital soft tissues within normal limits. Visualized paranasal sinuses are clear. No mastoid effusion. Other: None. ASPECTS [facility] Stroke Program Early CT Score) - Ganglionic level infarction (caudate, lentiform nuclei, internal capsule, insula, M1-M3 cortex): 7 - Supraganglionic infarction (M4-M6 cortex): 3 Total score (0-10 with 10 being normal): 10 IMPRESSION: 1. No acute intracranial infarct or other abnormality. 2. ASPECTS is 10. 3. Age-related cerebral atrophy with mild-to-moderate chronic microvascular ischemic disease. Critical Value/emergent results were called by telephone at the time of interpretation on 03/15/2020 at 9:27 pm to provider JOSEPH ZAMMIT , who verbally acknowledged these results. Electronically Signed   By: Jeannine Boga M.D.   On: 03/15/2020 21:28    PHYSICAL EXAM Pleasant middle-aged African-American male not in distress. . Afebrile. Head is nontraumatic. Neck is supple without bruit.    Cardiac exam no murmur or gallop. Lungs are clear to auscultation. Distal pulses are well felt. Neurological Exam ;  Awake alert oriented to time place and person.  No dysarthria or aphasia.  Extraocular movements are full range without nystagmus.  Blinks to threat bilaterally.  Subtle left lower facial weakness.  Tongue midline.  Motor system exam shows no upper or lower extremity drift but weakness of left grip and intrinsic hand muscles.  Orbits right over left upper extremity.  Trace weakness of left hip flexors and ankle dorsiflexors.  Sensation intact.  Gait not tested. ASSESSMENT/PLAN Mr. DUBLIN GRAYER is a 64 y.o. male with  history of HTN, HLD, chronic back pain, cervical DDD and sciatica presenting to [facility] ED with L facial droop and L sided weakness. Received tPA 03/15/2020 at 2139.   Stroke:   Tiny right brain infarct not visualized on MRI and small L parietal infarct  Subacute s/p tPA secondary to embol;ic etiologCode Stroke CT head No acute abnormality. Small vessel disease. Atrophy.  ASPECTS 10.     CT head repeat no change  CTA head & neck no LVO. Severe proximal L M1, moderate to severe B A2 and P2, extensive moderate to severe multifocal distal MCA branches stenoses. ICA siphons w/ R>L stenoses. Origins of great vessels w/ atherosclerosis. Ascending aorta dilitation up to 4cm  MRI  Subcentimeter L parietal infarct  2D Echo EF 70-75% w/ severe LVH. No source of embolus   LDL 119  HgbA1c pending   SCDs for VTE prophylaxis  No antithrombotic prior to admission, now on No antithrombotic as within 24h of tPA. Add aspirin if 24h imaging neg for hemorrhage.   Therapy recommendations:  HH PT, HH OT, supervision  Disposition:  Return home  Anticipate d/c Tues  Hypertension  Home meds:  norvasc 10, valsartain-HCTZ  Home meds resumed BP goal per post tPA protocol x 24h following tPA administration . Long-term BP goal normotensive  Hyperlipidemia  Home meds:  zetia 10, resumed in hospital  LDL 119, goal < 70  Add lipitor 80  Continue statin at discharge  Other Stroke Risk Factors  Cigarette smoker, advised to stop smoking  ETOH use, alcohol level 101, advised to drink no more than 2 drink(s) a day  Obesity, Body mass index is 31.02 kg/m., recommend weight loss, diet and exercise as appropriate   Other Active Problems  Chronic back pain, DDD, sciatica  Hospital day # 1 Patient presented with sudden onset of left-sided weakness following colitis likely from tiny right brain infarct not visualized on MRI but MRI does show what looks like incidental left parietal infarct  which is not explaining his symptoms.  Given his young age and circumstances of his stroke strong suspicion for PFO.  Recommend check TEE and TCD bubble study.  Check lower extremity Dopplers for DVT.  Continue ongoing stroke work-up and aggressive risk factor modification.  Strict blood pressure control as per post TPA protocol.  Continue close neurological monitoring This patient is critically ill and at significant risk of neurological worsening, death and care requires constant monitoring of vital signs, hemodynamics,respiratory and cardiac monitoring, extensive review of multiple databases, frequent neurological assessment, discussion with family, other specialists and medical decision making of high complexity.I have made any additions or clarifications directly to the above note.This critical care time does not reflect procedure time, or teaching time or supervisory time of PA/NP/Med Resident etc but could involve care discussion time.  I spent 30 minutes of neurocritical care time  in the care of  this patient.    Antony Contras, MD  To contact Stroke Continuity provider, please refer to [email or web address] After hours, contact General Neurology

## 2020-03-16 NOTE — Plan of Care (Signed)
  Problem: Education: Goal: Knowledge of disease or condition will improve Outcome: Progressing   

## 2020-03-16 NOTE — Plan of Care (Signed)
  Problem: Pain Managment: Goal: General experience of comfort will improve Outcome: Progressing   Problem: Safety: Goal: Ability to remain free from injury will improve Outcome: Progressing   Problem: Skin Integrity: Goal: Risk for impaired skin integrity will decrease Outcome: Progressing   Problem: Education: Goal: Knowledge of disease or condition will improve Outcome: Progressing Goal: Knowledge of secondary prevention will improve Outcome: Progressing Goal: Knowledge of patient specific risk factors addressed and post discharge goals established will improve Outcome: Progressing Goal: Individualized Educational Video(s) Outcome: Progressing   Problem: Nutrition: Goal: Risk of aspiration will decrease Outcome: Progressing   Problem: Nutrition: Goal: Adequate nutrition will be maintained Outcome: Completed/Met   Problem: Coping: Goal: Level of anxiety will decrease Outcome: Completed/Met   Problem: Elimination: Goal: Will not experience complications related to bowel motility Outcome: Completed/Met Goal: Will not experience complications related to urinary retention Outcome: Completed/Met   Problem: Coping: Goal: Will identify appropriate support needs Outcome: Completed/Met   Problem: Health Behavior/Discharge Planning: Goal: Ability to manage health-related needs will improve Outcome: Completed/Met   Problem: Self-Care: Goal: Ability to participate in self-care as condition permits will improve Outcome: Completed/Met Goal: Ability to communicate needs accurately will improve Outcome: Completed/Met

## 2020-03-16 NOTE — H&P (Signed)
Admission H&P    Chief Complaint: Left facial droop and left sided weakness  HPI: Carl Garza is an 64 y.o. male with a PMHx of HTN, HLD, chronic back pain, cervical DDD and sciatica, who initially presented to the [facility] with chief complaints of sudden onset dizziness, left facial droop and LUE weakness. NIHSS was 1. He was deemed by Teleneurology to be a tPA candidate and infusion was started after informed consent was obtained from the patient. He was transferred to [facility] for post-tPA management and full stroke work up.  On bedside interview at [facility], the patient states that he also had a transient deficit on Wednesday, consisting of "trouble getting the words out", which lasted for 1-2 minutes, then spontaneously resolved.   LSN: [id number] tPA Given: Yes  Past Medical History:  Diagnosis Date  . Chronic back pain   . DDD (degenerative disc disease), cervical   . Hypertension   . Sciatica     Past Surgical History:  Procedure Laterality Date  . COLONOSCOPY N/A 07/11/2018   Procedure: COLONOSCOPY;  Surgeon: Daneil Dolin, MD;  Location: [facility];  Service: Endoscopy;  Laterality: N/A;  9:30  . LAPAROSCOPIC APPENDECTOMY N/A 11/04/2017   Procedure: APPENDECTOMY LAPAROSCOPIC;  Surgeon: Aviva Signs, MD;  Location: [facility] ORS;  Service: General;  Laterality: N/A;  . POLYPECTOMY  07/11/2018   Procedure: POLYPECTOMY;  Surgeon: Daneil Dolin, MD;  Location: [facility];  Service: Endoscopy;;  sigmoid    Family History  Problem Relation Age of Onset  . Asthma Father    Social History:  reports that he has been smoking cigarettes. He has been smoking about 0.50 packs per day. He has never used smokeless tobacco. He reports current alcohol use. He reports that he does not use drugs.  Allergies: No Known Allergies  Medications Prior to Admission  Medication Sig Dispense Refill  . amLODipine (NORVASC) 10 MG tablet Take 10 mg by mouth every evening.   2  . ezetimibe (ZETIA) 10 MG tablet  Take 10 mg by mouth every morning.   5  . gabapentin (NEURONTIN) 300 MG capsule TAKE 1 CAPSULE(300 MG) BY MOUTH THREE TIMES DAILY (Patient taking differently: Take 300 mg by mouth 3 (three) times daily. ) 90 capsule 5  . ibuprofen (ADVIL,MOTRIN) 200 MG tablet Take 400 mg by mouth every 8 (eight) hours as needed (for pain.).    Marland Kitchen Multiple Vitamin (MULTIVITAMIN WITH MINERALS) TABS tablet Take 1 tablet by mouth every morning.    . potassium chloride SA (KLOR-CON) 20 MEQ tablet Take 20 mEq by mouth every morning.     . valsartan-hydrochlorothiazide (DIOVAN-HCT) 160-25 MG tablet Take 1 tablet by mouth every morning.       ROS:  As per HPI. Does not endorse any additional symptoms.   Physical Examination: Blood pressure 126/83, pulse (!) 115, temperature 98.4 F (36.9 C), temperature source Oral, resp. rate 14, height 5\' 8"  (1.727 m), weight 92.5 kg, SpO2 95 %.  HEENT-  Cavour/AT  Lungs - Respirations unlabored Extremities - No edema  Neurologic Examination: Mental Status: Alert, fully oriented, thought content appropriate. Good insight. Speech fluent with intact comprehension and naming. Able to follow a 3 step directional command without difficulty. Cranial Nerves: II:  Visual fields intact with no extinction to DSS. PERRL.  III,IV, VI: No ptosis. EOMI. No nystagmus.   V,VII: Smile symmetric. Facial temp sensation equal bilaterally VIII: hearing intact to conversation.  IX,X: No hypophonia LK:GMWNUU lag on the  left with shoulder shrug XII: Midline tongue extension  Motor: RUE 5/5 RLE 5/5 LUE 4/5 proximally and distally LLE 4+/5 Sensory: Temp and light touch intact throughout, bilaterally. No extinction to DSS.  Deep Tendon Reflexes:  2+ bilateral brachioradialis, biceps and patellae Cerebellar: No ataxia with FNF bilaterally  Gait: Deferred  Results for orders placed or performed during the hospital encounter of 03/15/20 (from the past 48 hour(s))  CBG monitoring, ED     Status:  Abnormal   Collection Time: 03/15/20  9:05 PM  Result Value Ref Range   Glucose-Capillary 105 (H) 70 - 99 mg/dL    Comment: Glucose reference range applies only to samples taken after fasting for at least 8 hours.  Ethanol     Status: Abnormal   Collection Time: 03/15/20  9:17 PM  Result Value Ref Range   Alcohol, Ethyl (B) 101 (H) <10 mg/dL    Comment: (NOTE) Lowest detectable limit for serum alcohol is 10 mg/dL.  For medical purposes only. Performed at [facility], [address], [place], [place] [address]   Protime-INR     Status: None   Collection Time: 03/15/20  9:17 PM  Result Value Ref Range   Prothrombin Time 13.1 11.4 - 15.2 seconds   INR 1.0 0.8 - 1.2    Comment: (NOTE) INR goal varies based on device and disease states. Performed at [facility], [address] [place], [place] [address]   APTT     Status: None   Collection Time: 03/15/20  9:17 PM  Result Value Ref Range   aPTT 32 24 - 36 seconds    Comment: Performed at [facility], [address] [place], [place] [address]  Comprehensive metabolic panel     Status: Abnormal   Collection Time: 03/15/20  9:17 PM  Result Value Ref Range   Sodium 141 135 - 145 mmol/L   Potassium 3.3 (L) 3.5 - 5.1 mmol/L   Chloride 106 98 - 111 mmol/L   CO2 24 22 - 32 mmol/L   Glucose, Bld 112 (H) 70 - 99 mg/dL    Comment: Glucose reference range applies only to samples taken after fasting for at least 8 hours.   BUN 20 8 - 23 mg/dL   Creatinine, Ser 1.64 (H) 0.61 - 1.24 mg/dL   Calcium 9.2 8.9 - 10.3 mg/dL   Total Protein 8.0 6.5 - 8.1 g/dL   Albumin 4.1 3.5 - 5.0 g/dL   AST 29 15 - 41 U/L   ALT 48 (H) 0 - 44 U/L   Alkaline Phosphatase 79 38 - 126 U/L   Total Bilirubin 0.4 0.3 - 1.2 mg/dL   GFR calc non Af Amer 44 (L) >60 mL/min   GFR calc Af Amer 52 (L) >60 mL/min   Anion gap 11 5 - 15    Comment: Performed at [facility], [address], [place], [place] [address]  CBC with Differential/Platelet     Status: Abnormal    Collection Time: 03/15/20  9:17 PM  Result Value Ref Range   WBC 11.5 (H) 4.0 - 10.5 K/uL   RBC 4.27 4.22 - 5.81 MIL/uL   Hemoglobin 12.9 (L) 13.0 - 17.0 g/dL   HCT 39.1 39 - 52 %   MCV 91.6 80.0 - 100.0 fL   MCH 30.2 26.0 - 34.0 pg   MCHC 33.0 30.0 - 36.0 g/dL   RDW 14.4 11.5 - 15.5 %   Platelets 321 150 - 400 K/uL   nRBC 0.0  0.0 - 0.2 %   Neutrophils Relative % 64 %   Neutro Abs 7.3 1.7 - 7.7 K/uL   Lymphocytes Relative 24 %   Lymphs Abs 2.8 0.7 - 4.0 K/uL   Monocytes Relative 9 %   Monocytes Absolute 1.1 (H) 0 - 1 K/uL   Eosinophils Relative 2 %   Eosinophils Absolute 0.3 0 - 0 K/uL   Basophils Relative 1 %   Basophils Absolute 0.1 0 - 0 K/uL   Immature Granulocytes 0 %   Abs Immature Granulocytes 0.02 0.00 - 0.07 K/uL    Comment: Performed at [facility], [address], [place], [place] [address]  I-stat chem 8, ED     Status: Abnormal   Collection Time: 03/15/20  9:29 PM  Result Value Ref Range   Sodium 142 135 - 145 mmol/L   Potassium 3.2 (L) 3.5 - 5.1 mmol/L   Chloride 104 98 - 111 mmol/L   BUN 19 8 - 23 mg/dL   Creatinine, Ser 1.90 (H) 0.61 - 1.24 mg/dL   Glucose, Bld 111 (H) 70 - 99 mg/dL    Comment: Glucose reference range applies only to samples taken after fasting for at least 8 hours.   Calcium, Ion 1.18 1.15 - 1.40 mmol/L   TCO2 25 22 - 32 mmol/L   Hemoglobin 13.3 13.0 - 17.0 g/dL   HCT 39.0 39 - 52 %  SARS Coronavirus 2 by RT PCR (hospital order, performed in [facility] hospital lab) Nasopharyngeal Nasopharyngeal Swab     Status: None   Collection Time: 03/15/20  9:49 PM   Specimen: Nasopharyngeal Swab  Result Value Ref Range   SARS Coronavirus 2 NEGATIVE NEGATIVE    Comment: (NOTE) SARS-CoV-2 target nucleic acids are NOT DETECTED.  The SARS-CoV-2 RNA is generally detectable in upper and lower respiratory specimens during the acute phase of infection. The lowest concentration of SARS-CoV-2 viral copies this assay can detect is 250 copies / mL. A  negative result does not preclude SARS-CoV-2 infection and should not be used as the sole basis for treatment or other patient management decisions.  A negative result may occur with improper specimen collection / handling, submission of specimen other than nasopharyngeal swab, presence of viral mutation(s) within the areas targeted by this assay, and inadequate number of viral copies (<250 copies / mL). A negative result must be combined with clinical observations, patient history, and epidemiological information.  Fact Sheet for Patients:   StrictlyIdeas.no  Fact Sheet for Healthcare Providers: BankingDealers.co.za  This test is not yet approved or  cleared by the [place] FDA and has been authorized for detection and/or diagnosis of SARS-CoV-2 by FDA under an Emergency Use Authorization (EUA).  This EUA will remain in effect (meaning this test can be used) for the duration of the COVID-19 declaration under Section 564(b)(1) of the Act, 21 U.S.C. section 360bbb-3(b)(1), unless the authorization is terminated or revoked sooner.  Performed at [facility], [address], [place], [place] [address]   Urinalysis, Routine w reflex microscopic     Status: None   Collection Time: 03/16/20 12:08 AM  Result Value Ref Range   Color, Urine YELLOW YELLOW   APPearance CLEAR CLEAR   Specific Gravity, Urine 1.010 1.005 - 1.030   pH 5.0 5.0 - 8.0   Glucose, UA NEGATIVE NEGATIVE mg/dL   Hgb urine dipstick NEGATIVE NEGATIVE   Bilirubin Urine NEGATIVE NEGATIVE   Ketones, ur NEGATIVE NEGATIVE mg/dL   Protein, ur NEGATIVE NEGATIVE mg/dL  Nitrite NEGATIVE NEGATIVE   Leukocytes,Ua NEGATIVE NEGATIVE    Comment: Performed at [facility], [address], [place], [place] [address]   CT Head Wo Contrast  Result Date: 03/15/2020 CLINICAL DATA:  Initial code stroke, started tPA, then new headache EXAM: CT HEAD WITHOUT CONTRAST TECHNIQUE: Contiguous  axial images were obtained from the base of the skull through the vertex without intravenous contrast. COMPARISON:  CT same day 9 p.m. FINDINGS: Brain: No evidence of acute territorial infarction, hemorrhage, hydrocephalus,extra-axial collection or mass lesion/mass effect. There is dilatation the ventricles and sulci consistent with age-related atrophy. Low-attenuation changes in the deep white matter consistent with small vessel ischemia. Vascular: No hyperdense vessel or unexpected calcification. Skull: The skull is intact. No fracture or focal lesion identified. Sinuses/Orbits: The visualized paranasal sinuses and mastoid air cells are clear. The orbits and globes intact. Other: None IMPRESSION: No acute intracranial abnormality, no significant change since the prior exam. There is dilatation the ventricles and sulci consistent with age-related atrophy. Low-attenuation changes in the deep white matter consistent with small vessel ischemia. Fresh in These results were called by telephone at the time of interpretation on 03/15/2020 at 10:13 pm to provider [facility] ZAMMIT , who verbally acknowledged these results. Electronically Signed   By: Prudencio Pair M.D.   On: 03/15/2020 22:21   CT HEAD CODE STROKE WO CONTRAST  Result Date: 03/15/2020 CLINICAL DATA:  Code stroke. Initial evaluation for unsteady gait, bilateral facial weakness, dizziness. EXAM: CT HEAD WITHOUT CONTRAST TECHNIQUE: Contiguous axial images were obtained from the base of the skull through the vertex without intravenous contrast. COMPARISON:  Prior head CT from 10/18/2003. FINDINGS: Brain: Generalized age-related cerebral atrophy with mild to moderate chronic microvascular ischemic disease. No acute intracranial hemorrhage. No acute large vessel territory infarct. No mass lesion, midline shift or mass effect. No hydrocephalus or extra-axial fluid collection. Vascular: No hyperdense vessel. Scattered vascular calcifications noted within the carotid  siphons. Skull: Scalp soft tissues and calvarium within normal limits. Sinuses/Orbits: Globes and orbital soft tissues within normal limits. Visualized paranasal sinuses are clear. No mastoid effusion. Other: None. ASPECTS [facility] Stroke Program Early CT Score) - Ganglionic level infarction (caudate, lentiform nuclei, internal capsule, insula, M1-M3 cortex): 7 - Supraganglionic infarction (M4-M6 cortex): 3 Total score (0-10 with 10 being normal): 10 IMPRESSION: 1. No acute intracranial infarct or other abnormality. 2. ASPECTS is 10. 3. Age-related cerebral atrophy with mild-to-moderate chronic microvascular ischemic disease. Critical Value/emergent results were called by telephone at the time of interpretation on 03/15/2020 at 9:27 pm to provider JOSEPH ZAMMIT , who verbally acknowledged these results. Electronically Signed   By: Jeannine Boga M.D.   On: 03/15/2020 21:28    Assessment: 64 y.o. male presenting in transfer from [facility] after receiving IV tPA for symptoms referable to a right MCA stroke 1. Exam reveals mild left sided weakness 2. CT head at OSH: No acute intracranial infarct or other abnormality. ASPECTS is 10. Age-related cerebral atrophy with mild-to-moderate chronic microvascular ischemic disease.  3. Stroke Risk Factors - HTN and HLD  Plan: 1. Admitted to the Neuro ICU under the Neurology service.  2. Post-tPA order set to include frequent neuro checks and BP management.  3. No antiplatelet medications or anticoagulants for at least 24 hours following tPA.  4. DVT prophylaxis with SCDs.  5. Will need to be started on a statin. Obtain baseline CK level.  6. Will need to be started on ASA if follow up CT at 24 hours is  negative for hemorrhagic conversion. 7. CTA of head and neck 8. MRI of the brain without contrast 9. PT consult, OT consult, Speech consult 10. TTE 11. Telemetry monitoring 12. Risk factor modification 13. IV NS at 75 cc/hr 14. HgbA1c, fasting lipid  panel  Electronically signed: Dr. Kerney Elbe 03/16/2020, 12:58 AM

## 2020-03-17 ENCOUNTER — Encounter (HOSPITAL_COMMUNITY): Payer: 59

## 2020-03-17 ENCOUNTER — Other Ambulatory Visit (HOSPITAL_COMMUNITY): Payer: 59

## 2020-03-17 DIAGNOSIS — E669 Obesity, unspecified: Secondary | ICD-10-CM | POA: Diagnosis present

## 2020-03-17 DIAGNOSIS — F17210 Nicotine dependence, cigarettes, uncomplicated: Secondary | ICD-10-CM | POA: Diagnosis present

## 2020-03-17 DIAGNOSIS — R0683 Snoring: Secondary | ICD-10-CM | POA: Diagnosis present

## 2020-03-17 DIAGNOSIS — I10 Essential (primary) hypertension: Secondary | ICD-10-CM | POA: Diagnosis present

## 2020-03-17 DIAGNOSIS — E785 Hyperlipidemia, unspecified: Secondary | ICD-10-CM | POA: Diagnosis present

## 2020-03-17 MED ORDER — ASPIRIN EC 81 MG PO TBEC: 81.0000 mg | DELAYED_RELEASE_TABLET | Freq: Every day | ORAL | Status: DC

## 2020-03-17 MED ORDER — ATORVASTATIN CALCIUM 80 MG PO TABS: 80.0000 mg | ORAL_TABLET | Freq: Every day | ORAL | 2 refills | Status: DC

## 2020-03-17 MED ORDER — NICOTINE 14 MG/24HR TD PT24
14.0000 mg | MEDICATED_PATCH | Freq: Every day | TRANSDERMAL | Status: DC
  Administered 2020-03-17: 14 mg via TRANSDERMAL
  Filled 2020-03-17: qty 1

## 2020-03-17 MED ORDER — CLOPIDOGREL BISULFATE 75 MG PO TABS: 75.0000 mg | ORAL_TABLET | Freq: Every day | ORAL | 0 refills | Status: AC

## 2020-03-17 MED ORDER — NICOTINE 14 MG/24HR TD PT24: 14.0000 mg | MEDICATED_PATCH | Freq: Every day | TRANSDERMAL | 0 refills | Status: DC

## 2020-03-17 MED ORDER — CLOPIDOGREL BISULFATE 75 MG PO TABS
75.0000 mg | ORAL_TABLET | Freq: Every day | ORAL | Status: DC
  Administered 2020-03-17: 75 mg via ORAL
  Filled 2020-03-17: qty 1

## 2020-03-17 MED ORDER — ASPIRIN 81 MG PO TBEC: 81.0000 mg | DELAYED_RELEASE_TABLET | Freq: Every day | ORAL | 11 refills | Status: DC

## 2020-03-17 NOTE — Discharge Summary (Addendum)
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   aspirin 81 MG EC tablet Take 1 tablet (81 mg total) by mouth daily. Swallow whole. Start taking on: March 18, 2020   atorvastatin 80 MG tablet Commonly known as: LIPITOR Take 1 tablet (80 mg total) by mouth daily at 6 PM.   clopidogrel 75 MG tablet Commonly known as: PLAVIX Take 1 tablet (75 mg total) by mouth daily. After 90 days, take aspirin alone Start taking on: March 18, 2020   gabapentin 300 MG capsule Commonly known as: NEURONTIN TAKE 1 CAPSULE(300 MG) BY MOUTH THREE TIMES DAILY What changed: See the new instructions.   multivitamin with minerals Tabs tablet Take 1 tablet by mouth every morning.   nicotine 14 mg/24hr patch Commonly known as: NICODERM CQ - dosed in mg/24 hours Place 1 patch (14 mg total) onto the skin daily. Start taking on: March 18, 2020   potassium chloride SA 20 MEQ tablet Commonly known as: KLOR-CON Take 20 mEq by mouth every morning.   valsartan-hydrochlorothiazide 160-25 MG tablet Commonly known as: DIOVAN-HCT Take 1 tablet by mouth every morning.        LABORATORY STUDIES CBC    Component Value Date/Time    WBC 11.5 (H) 03/15/2020 2117   RBC 4.27 03/15/2020 2117   HGB 13.3 03/15/2020 2129   HCT 39.0 03/15/2020 2129   PLT 321 03/15/2020 2117   MCV 91.6 03/15/2020 2117   MCH 30.2 03/15/2020 2117   MCHC 33.0 03/15/2020 2117   RDW 14.4 03/15/2020 2117   LYMPHSABS 2.8 03/15/2020 2117   MONOABS 1.1 (H) 03/15/2020 2117   EOSABS 0.3 03/15/2020 2117   BASOSABS 0.1 03/15/2020 2117   CMP    Component Value Date/Time   NA 142 03/15/2020 2129   K 3.2 (L) 03/15/2020 2129   CL 104 03/15/2020 2129   CO2 24 03/15/2020 2117   GLUCOSE 111 (H) 03/15/2020 2129   BUN 19 03/15/2020 2129   CREATININE 1.90 (H) 03/15/2020 2129   CALCIUM 9.2 03/15/2020 2117   PROT 8.0 03/15/2020 2117   ALBUMIN 4.1 03/15/2020 2117   AST 29 03/15/2020 2117   ALT 48 (H) 03/15/2020 2117   ALKPHOS 79 03/15/2020 2117   BILITOT 0.4 03/15/2020 2117   GFRNONAA 44 (L) 03/15/2020 2117   GFRAA 52 (L) 03/15/2020 2117   COAGS Lab Results  Component Value Date   INR 1.0 03/15/2020   Lipid Panel    Component Value Date/Time   CHOL 174 03/16/2020 0506   TRIG 125 03/16/2020 0506   HDL 30 (L) 03/16/2020  0506   CHOLHDL 5.8 03/16/2020 0506   VLDL 25 03/16/2020 0506   LDLCALC 119 (H) 03/16/2020 0506   HgbA1C  Lab Results  Component Value Date   HGBA1C 5.8 (H) 03/16/2020   Urinalysis    Component Value Date/Time   COLORURINE YELLOW 03/16/2020 0008   APPEARANCEUR CLEAR 03/16/2020 0008   LABSPEC 1.010 03/16/2020 0008   PHURINE 5.0 03/16/2020 0008   GLUCOSEU NEGATIVE 03/16/2020 0008   HGBUR NEGATIVE 03/16/2020 0008   BILIRUBINUR NEGATIVE 03/16/2020 0008   KETONESUR NEGATIVE 03/16/2020 0008   PROTEINUR NEGATIVE 03/16/2020 0008   UROBILINOGEN 0.2 04/23/2009 2349   NITRITE NEGATIVE 03/16/2020 0008   LEUKOCYTESUR NEGATIVE 03/16/2020 0008   Urine Drug Screen     Component Value Date/Time   LABOPIA NONE DETECTED 03/16/2020 0008   COCAINSCRNUR NONE DETECTED 03/16/2020 0008   LABBENZ NONE DETECTED 03/16/2020 0008    AMPHETMU NONE DETECTED 03/16/2020 0008   THCU NONE DETECTED 03/16/2020 0008   LABBARB NONE DETECTED 03/16/2020 0008    Alcohol Level    Component Value Date/Time   ETH 101 (H) 03/15/2020 2117     SIGNIFICANT DIAGNOSTIC STUDIES CT ANGIO HEAD W OR WO CONTRAST  Result Date: 03/16/2020 CLINICAL DATA:  Follow-up examination for acute stroke. EXAM: CT ANGIOGRAPHY HEAD AND NECK TECHNIQUE: Multidetector CT imaging of the head and neck was performed using the standard protocol during bolus administration of intravenous contrast. Multiplanar CT image reconstructions and MIPs were obtained to evaluate the vascular anatomy. Carotid stenosis measurements (when applicable) are obtained utilizing NASCET criteria, using the distal internal carotid diameter as the denominator. CONTRAST:  180mL OMNIPAQUE IOHEXOL 350 MG/ML SOLN COMPARISON:  Prior head CT from 03/15/2020. FINDINGS: CTA NECK FINDINGS Aortic arch: Visualized ascending aorta of dilated up to approximately 4 cm. Normal branch pattern seen at the aortic arch. Moderate atheromatous change about the origin of the great vessels without hemodynamically significant stenosis. Right carotid system: Right CCA patent from its origin to the bifurcation without stenosis. Mild scattered plaque about the right bifurcation without hemodynamically significant stenosis. Right ICA widely patent distally to the skull base without stenosis, dissection or occlusion. Left carotid system: Left CCA tortuous proximally but widely patent to the bifurcation without stenosis. Scattered calcified plaque about the origin of the left ICA without high-grade stenosis. Left ICA widely patent distally to the skull base without stenosis, dissection or occlusion. Vertebral arteries: Both vertebral arteries arise from the subclavian arteries. No significant proximal subclavian artery stenosis. Atheromatous plaque at the origins and proximal V1 segments bilaterally with associated mild multifocal  stenoses. Vertebral arteries otherwise widely patent without stenosis, dissection or occlusion. Skeleton: No acute osseous abnormality. No discrete or worrisome osseous lesions. Moderate multilevel cervical spondylosis, most pronounced at C4-5. Other neck: No other acute soft tissue abnormality within the neck. No mass lesion or adenopathy. Upper chest: Visualized upper chest demonstrates no acute finding. Review of the MIP images confirms the above findings CTA HEAD FINDINGS Anterior circulation: Petrous segments widely patent bilaterally. Extensive calcified plaque throughout the carotid siphons with associated moderate right worse than left multifocal stenoses. ICA termini well perfused. Left A1 patent. Right A1 hypoplastic and/or absent, accounting for the diminutive right ICA is compared to the left. Normal anterior communicating artery complex. Short-segment severe proximal right A2 stenosis (series 7, image 106). Additional short-segment severe left A2 stenosis seen slightly distally (series 7, image 87). Additional moderate to severe stenoses noted distally within the ACAs, which remain perfused to their distal aspects. Right M1  irregular but remains patent without stenosis. Normal right MCA bifurcation. On the left, there is a severe stenosis involving the proximal left M1 segment (series 10, image 22). Stenosis measures approximately 5 mm in length. Left M1 patent distally. Normal left MCA bifurcation. Extensive atheromatous change throughout the MCA branches bilaterally with associated moderate to severe multifocal segmental stenoses, advanced in nature. Posterior circulation: Both vertebral arteries widely patent to the vertebrobasilar junction. Left vertebral artery dominant. Left PICA patent at its origin. Right PICA not seen. Basilar widely patent to its distal aspect without stenosis. Superior cerebral arteries patent bilaterally. Both PCAs primarily supplied via the basilar. Multifocal moderate to  severe bilateral P2 stenoses, right worse than left. PCAs remain perfused to their distal aspects, although demonstrate distal small vessel atheromatous irregularity. Venous sinuses: Grossly patent allowing for timing the contrast bolus. Anatomic variants: Hypoplastic/absent right A1 segment. No intracranial aneurysm. Review of the MIP images confirms the above findings IMPRESSION: 1. Negative CTA for large vessel occlusion. 2. Short-segment severe proximal left M1 stenosis, with additional severe intracranial atherosclerotic disease as above. Notable findings include moderate to severe bilateral A2 and P2 stenoses, with extensive moderate to severe multifocal stenoses throughout the distal MCA branches. 3. Extensive calcified plaque throughout the carotid siphons with associated moderate right worse than left multifocal stenoses. 4. Mild-to-moderate atheromatous change about the origins of the great vessels and carotid bifurcations without flow-limiting stenosis. 5. Dilatation of the ascending aorta up to 4 cm in diameter. Recommend annual imaging followup by CTA or MRA. This recommendation follows 2010 ACCF/AHA/AATS/ACR/ASA/SCA/SCAI/SIR/STS/SVM Guidelines for the Diagnosis and Management of Patients with Thoracic Aortic Disease. Circulation. 2010; 121: X937-J696. Aortic aneurysm NOS (ICD10-I71.9) Electronically Signed   By: Jeannine Boga M.D.   On: 03/16/2020 04:56   CT Head Wo Contrast  Result Date: 03/15/2020 CLINICAL DATA:  Initial code stroke, started tPA, then new headache EXAM: CT HEAD WITHOUT CONTRAST TECHNIQUE: Contiguous axial images were obtained from the base of the skull through the vertex without intravenous contrast. COMPARISON:  CT same day 9 p.m. FINDINGS: Brain: No evidence of acute territorial infarction, hemorrhage, hydrocephalus,extra-axial collection or mass lesion/mass effect. There is dilatation the ventricles and sulci consistent with age-related atrophy. Low-attenuation changes  in the deep white matter consistent with small vessel ischemia. Vascular: No hyperdense vessel or unexpected calcification. Skull: The skull is intact. No fracture or focal lesion identified. Sinuses/Orbits: The visualized paranasal sinuses and mastoid air cells are clear. The orbits and globes intact. Other: None IMPRESSION: No acute intracranial abnormality, no significant change since the prior exam. There is dilatation the ventricles and sulci consistent with age-related atrophy. Low-attenuation changes in the deep white matter consistent with small vessel ischemia. Fresh in These results were called by telephone at the time of interpretation on 03/15/2020 at 10:13 pm to provider Carl Garza , who verbally acknowledged these results. Electronically Signed   By: Prudencio Pair M.D.   On: 03/15/2020 22:21   CT ANGIO NECK W OR WO CONTRAST  Result Date: 03/16/2020 CLINICAL DATA:  Follow-up examination for acute stroke. EXAM: CT ANGIOGRAPHY HEAD AND NECK TECHNIQUE: Multidetector CT imaging of the head and neck was performed using the standard protocol during bolus administration of intravenous contrast. Multiplanar CT image reconstructions and MIPs were obtained to evaluate the vascular anatomy. Carotid stenosis measurements (when applicable) are obtained utilizing NASCET criteria, using the distal internal carotid diameter as the denominator. CONTRAST:  148mL OMNIPAQUE IOHEXOL 350 MG/ML SOLN COMPARISON:  Prior head CT from 03/15/2020. FINDINGS:  CTA NECK FINDINGS Aortic arch: Visualized ascending aorta of dilated up to approximately 4 cm. Normal branch pattern seen at the aortic arch. Moderate atheromatous change about the origin of the great vessels without hemodynamically significant stenosis. Right carotid system: Right CCA patent from its origin to the bifurcation without stenosis. Mild scattered plaque about the right bifurcation without hemodynamically significant stenosis. Right ICA widely patent distally  to the skull base without stenosis, dissection or occlusion. Left carotid system: Left CCA tortuous proximally but widely patent to the bifurcation without stenosis. Scattered calcified plaque about the origin of the left ICA without high-grade stenosis. Left ICA widely patent distally to the skull base without stenosis, dissection or occlusion. Vertebral arteries: Both vertebral arteries arise from the subclavian arteries. No significant proximal subclavian artery stenosis. Atheromatous plaque at the origins and proximal V1 segments bilaterally with associated mild multifocal stenoses. Vertebral arteries otherwise widely patent without stenosis, dissection or occlusion. Skeleton: No acute osseous abnormality. No discrete or worrisome osseous lesions. Moderate multilevel cervical spondylosis, most pronounced at C4-5. Other neck: No other acute soft tissue abnormality within the neck. No mass lesion or adenopathy. Upper chest: Visualized upper chest demonstrates no acute finding. Review of the MIP images confirms the above findings CTA HEAD FINDINGS Anterior circulation: Petrous segments widely patent bilaterally. Extensive calcified plaque throughout the carotid siphons with associated moderate right worse than left multifocal stenoses. ICA termini well perfused. Left A1 patent. Right A1 hypoplastic and/or absent, accounting for the diminutive right ICA is compared to the left. Normal anterior communicating artery complex. Short-segment severe proximal right A2 stenosis (series 7, image 106). Additional short-segment severe left A2 stenosis seen slightly distally (series 7, image 87). Additional moderate to severe stenoses noted distally within the ACAs, which remain perfused to their distal aspects. Right M1 irregular but remains patent without stenosis. Normal right MCA bifurcation. On the left, there is a severe stenosis involving the proximal left M1 segment (series 10, image 22). Stenosis measures approximately  5 mm in length. Left M1 patent distally. Normal left MCA bifurcation. Extensive atheromatous change throughout the MCA branches bilaterally with associated moderate to severe multifocal segmental stenoses, advanced in nature. Posterior circulation: Both vertebral arteries widely patent to the vertebrobasilar junction. Left vertebral artery dominant. Left PICA patent at its origin. Right PICA not seen. Basilar widely patent to its distal aspect without stenosis. Superior cerebral arteries patent bilaterally. Both PCAs primarily supplied via the basilar. Multifocal moderate to severe bilateral P2 stenoses, right worse than left. PCAs remain perfused to their distal aspects, although demonstrate distal small vessel atheromatous irregularity. Venous sinuses: Grossly patent allowing for timing the contrast bolus. Anatomic variants: Hypoplastic/absent right A1 segment. No intracranial aneurysm. Review of the MIP images confirms the above findings IMPRESSION: 1. Negative CTA for large vessel occlusion. 2. Short-segment severe proximal left M1 stenosis, with additional severe intracranial atherosclerotic disease as above. Notable findings include moderate to severe bilateral A2 and P2 stenoses, with extensive moderate to severe multifocal stenoses throughout the distal MCA branches. 3. Extensive calcified plaque throughout the carotid siphons with associated moderate right worse than left multifocal stenoses. 4. Mild-to-moderate atheromatous change about the origins of the great vessels and carotid bifurcations without flow-limiting stenosis. 5. Dilatation of the ascending aorta up to 4 cm in diameter. Recommend annual imaging followup by CTA or MRA. This recommendation follows 2010 ACCF/AHA/AATS/ACR/ASA/SCA/SCAI/SIR/STS/SVM Guidelines for the Diagnosis and Management of Patients with Thoracic Aortic Disease. Circulation. 2010; 121: A416-S063. Aortic aneurysm NOS (ICD10-I71.9) Electronically Signed  By: Jeannine Boga M.D.   On: 03/16/2020 04:56   MR BRAIN WO CONTRAST  Result Date: 03/16/2020 CLINICAL DATA:  Code stroke follow-up post tPA EXAM: MRI HEAD WITHOUT CONTRAST TECHNIQUE: Multiplanar, multiecho pulse sequences of the brain and surrounding structures were obtained without intravenous contrast. COMPARISON:  Correlation made with recent CT imaging FINDINGS: Brain: There is a subcentimeter acute cortical/subcortical infarction of the left superior parietal lobule. There is no evidence of intracranial hemorrhage. Prominence of the ventricles and sulci reflects mild generalized parenchymal volume loss. Patchy and confluent areas of T2 hyperintensity in the supratentorial white matter nonspecific but may reflect mild to moderate chronic microvascular ischemic changes. Small chronic right cerebellar infarcts. There is no intracranial mass, mass effect, or edema. There is no hydrocephalus or extra-axial fluid collection. Vascular: Major vessel flow voids at the skull base are preserved. Skull and upper cervical spine: Normal marrow signal is preserved. Sinuses/Orbits: Minor mucosal thickening.  Orbits are unremarkable. Other: Sella is unremarkable.  Mastoid air cells are clear. IMPRESSION: Subcentimeter acute left parietal infarct. Mild to moderate chronic microvascular ischemic changes. Electronically Signed   By: Macy Mis M.D.   On: 03/16/2020 17:15   ECHOCARDIOGRAM COMPLETE  Result Date: 03/16/2020    ECHOCARDIOGRAM REPORT   Patient Name:   Carl Garza Date of Exam: 03/16/2020 Medical Rec #:  [id number]        Height:       68.0 in Accession #:    [id number]       Weight:       204.0 lb Date of Birth:  April 18, 1956        BSA:          2.061 m Patient Age:    80 years         BP:           111/86 mmHg Patient Gender: M                HR:           93 bpm. Exam Location:  Inpatient Procedure: 2D Echo and Intracardiac Opacification Agent Indications:    Stroke I163.9  History:        Patient has no  prior history of Echocardiogram examinations.                 Risk Factors:Hypertension.  Sonographer:    Mikki Santee RDCS (AE) Referring Phys: [place]  1. Definity used; hyperdynamic LV function; severe LVH; intracavitary gradient of 2.4 m/s; grade 1 diastolic dysfunction; mildly dilated ascending aorta.  2. Left ventricular ejection fraction, by estimation, is 70 to 75%. The left ventricle has hyperdynamic function. The left ventricle has no regional wall motion abnormalities. There is severe left ventricular hypertrophy. Left ventricular diastolic parameters are consistent with Grade I diastolic dysfunction (impaired relaxation).  3. Right ventricular systolic function is normal. The right ventricular size is normal.  4. The mitral valve is normal in structure. No evidence of mitral valve regurgitation. No evidence of mitral stenosis.  5. The aortic valve is tricuspid. Aortic valve regurgitation is not visualized. Mild aortic valve sclerosis is present, with no evidence of aortic valve stenosis.  6. Aortic dilatation noted. Aneurysm of the ascending aorta. There is mild dilatation of the ascending aorta measuring 39 mm.  7. The inferior vena cava is normal in size with greater than 50% respiratory variability, suggesting right atrial pressure of 3 mmHg. FINDINGS  Left Ventricle: Left ventricular  ejection fraction, by estimation, is 70 to 75%. The left ventricle has hyperdynamic function. The left ventricle has no regional wall motion abnormalities. Definity contrast agent was given IV to delineate the left ventricular endocardial borders. The left ventricular internal cavity size was normal in size. There is severe left ventricular hypertrophy. Left ventricular diastolic parameters are consistent with Grade I diastolic dysfunction (impaired relaxation). Right Ventricle: The right ventricular size is normal.Right ventricular systolic function is normal. Left Atrium: Left atrial size was  normal in size. Right Atrium: Right atrial size was normal in size. Pericardium: Trivial pericardial effusion is present. Mitral Valve: The mitral valve is normal in structure. Normal mobility of the mitral valve leaflets. No evidence of mitral valve regurgitation. No evidence of mitral valve stenosis. Tricuspid Valve: The tricuspid valve is normal in structure. Tricuspid valve regurgitation is not demonstrated. No evidence of tricuspid stenosis. Aortic Valve: The aortic valve is tricuspid. Aortic valve regurgitation is not visualized. Mild aortic valve sclerosis is present, with no evidence of aortic valve stenosis. Pulmonic Valve: The pulmonic valve was normal in structure. Pulmonic valve regurgitation is not visualized. No evidence of pulmonic stenosis. Aorta: Aortic dilatation noted. There is mild dilatation of the ascending aorta measuring 39 mm. There is an aneurysm involving the ascending aorta. Venous: The inferior vena cava is normal in size with greater than 50% respiratory variability, suggesting right atrial pressure of 3 mmHg. IAS/Shunts: No atrial level shunt detected by color flow Doppler. Additional Comments: Definity used; hyperdynamic LV function; severe LVH; intracavitary gradient of 2.4 m/s; grade 1 diastolic dysfunction; mildly dilated ascending aorta.  LEFT VENTRICLE PLAX 2D LVIDd:         4.10 cm  Diastology LVIDs:         2.40 cm  LV e' lateral:   5.55 cm/s LV PW:         1.50 cm  LV E/e' lateral: 10.7 LV IVS:        1.60 cm  LV e' medial:    4.35 cm/s LVOT diam:     2.30 cm  LV E/e' medial:  13.7 LV SV:         76 LV SV Index:   37 LVOT Area:     4.15 cm  RIGHT VENTRICLE RV S prime:     12.60 cm/s TAPSE (M-mode): 1.3 cm LEFT ATRIUM           Index       RIGHT ATRIUM           Index LA diam:      3.40 cm 1.65 cm/m  RA Area:     12.40 cm LA Vol (A2C): 41.2 ml 19.99 ml/m RA Volume:   23.80 ml  11.55 ml/m LA Vol (A4C): 48.1 ml 23.34 ml/m  AORTIC VALVE LVOT Vmax:   118.00 cm/s LVOT Vmean:   64.000 cm/s LVOT VTI:    0.184 m  AORTA Ao Root diam: 3.50 cm MITRAL VALVE MV Area (PHT): 3.31 cm    SHUNTS MV Decel Time: 230 msec    Systemic VTI:  0.18 m MV E velocity: 59.55 cm/s  Systemic Diam: 2.30 cm MV A velocity: 82.30 cm/s MV E/A ratio:  0.72 Kirk Ruths MD Electronically signed by Kirk Ruths MD Signature Date/Time: 03/16/2020/2:52:24 PM    Final    CT HEAD CODE STROKE WO CONTRAST  Result Date: 03/15/2020 CLINICAL DATA:  Code stroke. Initial evaluation for unsteady gait, bilateral facial weakness, dizziness. EXAM: CT HEAD WITHOUT CONTRAST  TECHNIQUE: Contiguous axial images were obtained from the base of the skull through the vertex without intravenous contrast. COMPARISON:  Prior head CT from 10/18/2003. FINDINGS: Brain: Generalized age-related cerebral atrophy with mild to moderate chronic microvascular ischemic disease. No acute intracranial hemorrhage. No acute large vessel territory infarct. No mass lesion, midline shift or mass effect. No hydrocephalus or extra-axial fluid collection. Vascular: No hyperdense vessel. Scattered vascular calcifications noted within the carotid siphons. Skull: Scalp soft tissues and calvarium within normal limits. Sinuses/Orbits: Globes and orbital soft tissues within normal limits. Visualized paranasal sinuses are clear. No mastoid effusion. Other: None. ASPECTS [facility] Stroke Program Early CT Score) - Ganglionic level infarction (caudate, lentiform nuclei, internal capsule, insula, M1-M3 cortex): 7 - Supraganglionic infarction (M4-M6 cortex): 3 Total score (0-10 with 10 being normal): 10 IMPRESSION: 1. No acute intracranial infarct or other abnormality. 2. ASPECTS is 10. 3. Age-related cerebral atrophy with mild-to-moderate chronic microvascular ischemic disease. Critical Value/emergent results were called by telephone at the time of interpretation on 03/15/2020 at 9:27 pm to provider Carl Garza , who verbally acknowledged these results. Electronically  Signed   By: Jeannine Boga M.D.   On: 03/15/2020 21:28      HISTORY OF PRESENT ILLNESS Carl Garza is an 64 y.o. male with a PMHx of HTN, HLD, chronic back pain, cervical DDD and sciatica, who initially presented to the [facility] ED with chief complaints of sudden onset dizziness, left facial droop and LUE weakness. NIHSS was 1. He was deemed by Teleneurology to be a tPA candidate and infusion was started after informed consent was obtained from the patient. He was transferred to [facility] for post-tPA management and full stroke work up. On bedside interview at [place], the patient states that he also had a transient deficit on Wednesday, consisting of "trouble getting the words out", which lasted for 1-2 minutes, then spontaneously resolved. He was LKW 03/16/2019 at 2000. Received tPA 03/15/2020 at 2139.   HOSPITAL COURSE Carl Garza is a 64 y.o. male with history of HTN, HLD, chronic back pain, cervical DDD and sciatica presenting to [facility] ED with L facial droop and L sided weakness. Received tPA 03/15/2020 at 2139.   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 Small vessel disease. Atrophy. ASPECTS 10.    CT head repeat no change CTA head & neck no LVO. Severe proximal L M1, moderate to severe B A2 and P2, extensive moderate to severe multifocal distal MCA branches stenoses. ICA siphons w/ R>L stenoses. Origins of great vessels w/ atherosclerosis. Ascending aorta dilitation up to 4cm MRI  Subcentimeter L parietal infarct 2D Echo EF 70-75% w/ severe LVH. No source of embolus  LDL 119 HgbA1c 5.8  No antithrombotic prior to admission, given large vessel stenosis, added DAPT x 3 months then aspirin alone  Therapy recommendations:  HH PT, HH OT, SLP supervision -> OP therapy ok,  wife to provide transportation Disposition:  Return home   Hypertension Home meds:  norvasc 10, valsartain-HCTZ Home meds resumed BP goal < 180 post tPA Long-term BP goal normotensive   Hyperlipidemia Home meds:  zetia 10, resumed in hospital LDL 119, goal < 70 Added lipitor 80. Stop Zetia Continue statin at discharge   Other Stroke Risk Factors Cigarette smoker, advised to stop smoking. Nicotine  patch added. Patient educated on patch use ETOH use, alcohol level 101, advised to drink no more than 2 drink(s) a day Obesity, Body mass index is 31.02 kg/m., recommend weight loss, diet and exercise as appropriate. Diet education given. Snores, ? obstructive sleep apnea - can consider OP evaluation   Other Active Problems Chronic back pain, DDD, sciatica   DISCHARGE EXAM Blood pressure 104/74, pulse 89, temperature 99.6 F (37.6 C), temperature source Oral, resp. rate 18, height 5\' 8"  (1.727 m), weight 92.5 kg, SpO2 94 %. Pleasant middle-aged African-American male not in distress. . Afebrile. Head is nontraumatic. Neck is supple without bruit.    Cardiac exam no murmur or gallop. Lungs are clear to auscultation. Distal pulses are well felt. Neurological Exam ;  Awake alert oriented to time place and person.  No dysarthria or aphasia.  Extraocular movements are full range without nystagmus.  Blinks to threat bilaterally.  Subtle left lower facial weakness.  Tongue midline.  Motor system exam shows no upper or lower extremity drift but weakness of left grip and intrinsic hand muscles.  Orbits right over left upper extremity.  Trace weakness of left hip flexors and ankle dorsiflexors.  Sensation intact.  Gait not tested.  Discharge Diet   Heart healthy thin liquids  DISCHARGE PLAN Disposition:  Return home Outpatient PT, OT, SLP aspirin 81 mg daily and aspirin 325 mg daily for secondary stroke prevention for 3 months then aspirin alone. Ongoing stroke risk factor control by Primary Care  Physician at time of discharge Follow-up PCP Lucianne Lei, MD in 2 weeks. Follow-up in [facility] in 4 weeks, office to schedule an appointment.   32 minutes were spent preparing discharge.  Burnetta Sabin, MSN, APRN, ANVP-BC, AGPCNP-BC Advanced Practice Stroke Nurse Miguel Barrera for Schedule & Pager information 03/17/2020 2:06 PM   I have personally obtained history,examined this patient, reviewed notes, independently viewed imaging studies, participated in medical decision making and plan of care.ROS completed by me personally and pertinent positives fully documented  I have made any additions or clarifications directly to the above note. Agree with note above.    Antony Contras, MD Medical Director [facility] Pager: [phone] 03/17/2020 4:37 PM

## 2020-03-17 NOTE — Progress Notes (Signed)
Occupational Therapy Treatment Patient Details Name: Carl Garza MRN: [id number] DOB: 1955-12-11 Today's Date: 03/17/2020    History of present illness Carl Garza is an 63 y.o. male with a PMHx of HTN, HLD, chronic back pain, cervical DDD and sciatica, who [facility] with chief complaints of sudden onset dizziness, left facial droop and LUE weakness. NIHSS was 1. Pt received tPA. imaging revealed R MCA stroke. Pt also with R lateral ankle pain s/p fall.   OT comments  Pt scored 6/28 on the Short Blessed Test demonstrating deficits with memory, attention, and sequencing.  He is able to perform ADLs with min guard assist to supervision.  Recommend HHOT.   Follow Up Recommendations  Home health OT;Supervision - Intermittent    Equipment Recommendations  None recommended by OT    Recommendations for Other Services      Precautions / Restrictions Precautions Precautions: Fall Precaution Comments: R ankle pain       Mobility Bed Mobility                  Transfers Overall transfer level: Needs assistance Equipment used: None Transfers: Sit to/from Stand Sit to Stand: Supervision              Balance Overall balance assessment: Mild deficits observed, not formally tested                                         ADL either performed or assessed with clinical judgement   ADL Overall ADL's : Needs assistance/impaired         Upper Body Bathing: Supervision/ safety;Standing   Lower Body Bathing: Supervison/ safety;Sit to/from stand Lower Body Bathing Details (indicate cue type and reason): simulated shower in standing.  Pt does have grab bar.  No LOB noted                  Tub/ Shower Transfer: Min guard;Ambulation           Vision       Perception     Praxis      Cognition Arousal/Alertness: Awake/alert Behavior During Therapy: WFL for tasks assessed/performed Overall Cognitive Status:  Impaired/Different from baseline                                 General Comments: Pt scored 6/28 on the Short Blessed Test.  He demonstrated deficits with memory, sequencing and attention.  He endorses that he is not at baseline yet. Discussed post acute fatigue with he and wife         Exercises     Shoulder Instructions       General Comments VSS    Pertinent Vitals/ Pain       Pain Assessment: 0-10 Pain Score: 8  Pain Location: R lateral ankle Pain Descriptors / Indicators: Sore;Throbbing Pain Intervention(s): Monitored during session  Home Living                                          Prior Functioning/Environment              Frequency  Min 2X/week        Progress Toward Goals  OT  Goals(current goals can now be found in the care plan section)  Progress towards OT goals: Progressing toward goals     Plan Discharge plan remains appropriate;Equipment recommendations need to be updated    Co-evaluation                 AM-PAC OT "6 Clicks" Daily Activity     Outcome Measure   Help from another person eating meals?: None Help from another person taking care of personal grooming?: A Little Help from another person toileting, which includes using toliet, bedpan, or urinal?: A Little Help from another person bathing (including washing, rinsing, drying)?: A Little Help from another person to put on and taking off regular upper body clothing?: A Little Help from another person to put on and taking off regular lower body clothing?: A Little 6 Click Score: 19    End of Session Equipment Utilized During Treatment: Gait belt  OT Visit Diagnosis: Unsteadiness on feet (R26.81);Cognitive communication deficit (R41.841) Symptoms and signs involving cognitive functions: Cerebral infarction   Activity Tolerance Patient tolerated treatment well   Patient Left in chair;with call bell/phone within reach;with chair alarm set    Nurse Communication Mobility status        Time: 8242-3536 OT Time Calculation (min): 25 min  Charges: OT General Charges $OT Visit: 1 Visit OT Treatments $Self Care/Home Management : 23-37 mins  Nilsa Nutting OTR/L Acute Rehabilitation Services Pager [phone] Office [phone]    Lucille Passy M 03/17/2020, 4:03 PM

## 2020-03-17 NOTE — Progress Notes (Signed)
Discharge instructions reviewed again with pt and his wife at this time. Reviewed and discussed stroke education, medications and their importance to prevent further stroke, smoking cessation and handouts given on stroke, stroke prevention, new meds, and on smoking cessation and informed of follow up phone calls from stroke group to see how he is doing and if any questions.  . Pt and wife verbalized understanding and able to do some teach back.  Copy of instructions given to pt and wife.  Pt d/c'd via wheelchair with belongings, with wife.            Escorted by unit staff.

## 2020-03-17 NOTE — Progress Notes (Signed)
Physical Therapy Treatment Patient Details Name: Carl Garza MRN: [id number] DOB: [birth year] Today's Date: 03/17/2020    History of Present Illness Carl Garza is an 64 y.o. male with a PMHx of HTN, HLD, chronic back pain, cervical DDD and sciatica, who initially presented to [facility] with chief complaints of sudden onset dizziness, left facial droop and LUE weakness. NIHSS was 1. Pt received tPA. imaging revealed R MCA stroke. Pt also with R lateral ankle pain s/p fall.    PT Comments    Pt progressing towards all goals. Pt with improved processing and recall. Pt continues to report R ankle pain, off the shelf brace put on and pt able to amb 175' with RW and complete stair negotiation with min guard assist. Pt safe to d/c home with family once medically stable. Acute PT to cont to follow.    Follow Up Recommendations  Home health PT;Supervision - Intermittent     Equipment Recommendations  None recommended by PT    Recommendations for Other Services       Precautions / Restrictions Precautions Precautions: Fall Precaution Comments: R ankle pain Restrictions Weight Bearing Restrictions: No    Mobility  Bed Mobility Overal bed mobility: Modified Independent             General bed mobility comments: HOB elevated, no use of bedrail  Transfers Overall transfer level: Needs assistance Equipment used: Rolling walker (2 wheeled) Transfers: Sit to/from Stand Sit to Stand: Supervision         General transfer comment: pt with good hand placement  Ambulation/Gait Ambulation/Gait assistance: Supervision Gait Distance (Feet): 175 Feet Assistive device: Rolling walker (2 wheeled) Gait Pattern/deviations: Step-through pattern;Decreased stride length;Antalgic Gait velocity: dec Gait velocity interpretation: <1.31 ft/sec, indicative of household ambulator General Gait Details: verbal cues to decreased bilat UE support/not to press so hard down on walker, pt with  increased step height and length, more fluid gait pattern however antalgic due to R ankle pain, no episodes of LOB but remains dependent on RW   Stairs Stairs: Yes Stairs assistance: Min guard Stair Management: One rail Left;Step to pattern Number of Stairs: 12 General stair comments: pt educated on sequencing "up with the good, down with the bad", pt with good carryover and no difficulty, able to recall sequence   Wheelchair Mobility    Modified Rankin (Stroke Patients Only) Modified Rankin (Stroke Patients Only) Pre-Morbid Rankin Score: No symptoms Modified Rankin: No significant disability     Balance Overall balance assessment: Mild deficits observed, not formally tested                                          Cognition Arousal/Alertness: Awake/alert Behavior During Therapy: WFL for tasks assessed/performed Overall Cognitive Status: Within Functional Limits for tasks assessed                                 General Comments: pt improved and was able to follow multi-step commands consistently, able to stay on task and demo'd improved processing speed      Exercises      General Comments General comments (skin integrity, edema, etc.): VSS      Pertinent Vitals/Pain Pain Assessment: 0-10 Pain Score: 6  Pain Location: R lateral ankle Pain Descriptors / Indicators: Sore;Throbbing Pain Intervention(s):  (placed off  the shelf ankle brace on)    Home Living                      Prior Function            PT Goals (current goals can now be found in the care plan section) Acute Rehab PT Goals Patient Stated Goal: home Progress towards PT goals: Progressing toward goals    Frequency    Min 3X/week      PT Plan Frequency needs to be updated    Co-evaluation              AM-PAC PT "6 Clicks" Mobility   Outcome Measure  Help needed turning from your back to your side while in a flat bed without using  bedrails?: None Help needed moving from lying on your back to sitting on the side of a flat bed without using bedrails?: None Help needed moving to and from a bed to a chair (including a wheelchair)?: None Help needed standing up from a chair using your arms (e.g., wheelchair or bedside chair)?: A Little Help needed to walk in hospital room?: A Little Help needed climbing 3-5 steps with a railing? : A Little 6 Click Score: 21    End of Session Equipment Utilized During Treatment: Gait belt Activity Tolerance: Patient tolerated treatment well Patient left: in chair;with call bell/phone within reach;with chair alarm set;with nursing/sitter in room;with family/visitor present Nurse Communication: Mobility status PT Visit Diagnosis: Unsteadiness on feet (R26.81);Muscle weakness (generalized) (M62.81);Difficulty in walking, not elsewhere classified (R26.2)     Time: 8832-5498 PT Time Calculation (min) (ACUTE ONLY): 18 min  Charges:  $Gait Training: 8-22 mins                     Carl Garza, PT, DPT Acute Rehabilitation Services Pager #: [phone] Office #: [phone]    Berline Lopes 03/17/2020, 9:24 AM

## 2020-03-17 NOTE — Progress Notes (Signed)
Discharge instructions reviewed with pt (wife not present at this time), she will be returning soon.  Copy of instructions given to pt, scripts sent to pt's pharmacy electronically by MD, pt informed of scripts at his pharmacy.   Will review meds and discharge instructions, education information with wife also when she arrives. She is on her way to hospital.

## 2020-03-17 NOTE — TOC Initial Note (Signed)
Transition of Care [facility]) - Initial/Assessment Note    Patient Details  Name: Carl Garza MRN: [id number] Date of Birth: [birth year]  Transition of Care [facility]) CM/SW Contact:    Ella Bodo, RN Phone Number: 03/17/2020, 11:51 AM  Clinical Narrative: Carl Garza is an 63 y.o. male with a PMHx of HTN, HLD, chronic back pain, cervical DDD and sciatica, who [facility] presented to [facility] with chief complaints of sudden onset dizziness, left facial droop and LUE weakness. NIHSS was 1. Pt received tPA. imaging revealed R MCA stroke.   PTA, pt independent and living with spouse, who can provide assistance at discharge.  PT/OT/ST recommending OP follow up, and pt agreeable to services.  Referral made to [facility] for PT/OT and ST follow up.  Rehab information placed on AVS.                   Expected Discharge Plan: OP Rehab Barriers to Discharge: Barriers Resolved   Patient Goals and CMS Choice        Expected Discharge Plan and Services Expected Discharge Plan: OP Rehab   Discharge Planning Services: CM Consult   Living arrangements for the past 2 months: Single Family Home                                      Prior Living Arrangements/Services Living arrangements for the past 2 months: Single Family Home Lives with:: Spouse Patient language and need for interpreter reviewed:: Yes Do you feel safe going back to the place where you live?: Yes      Need for Family Participation in Patient Care: Yes (Comment) Care giver support system in place?: Yes (comment)   Criminal Activity/Legal Involvement Pertinent to Current Situation/Hospitalization: No - Comment as needed  Activities of Daily Living Home Assistive Devices/Equipment: Cane (specify quad or straight) ADL Screening (condition at time of admission) Patient's cognitive ability adequate to safely complete daily activities?: Yes Is the patient deaf or have difficulty hearing?: No Does the  patient have difficulty seeing, even when wearing glasses/contacts?: No Does the patient have difficulty concentrating, remembering, or making decisions?: No Patient able to express need for assistance with ADLs?: Yes Does the patient have difficulty dressing or bathing?: No Independently performs ADLs?: Yes (appropriate for developmental age) Does the patient have difficulty walking or climbing stairs?: No Weakness of Legs: Right Weakness of Arms/Hands: Right  Permission Sought/Granted                  Emotional Assessment Appearance:: Appears stated age Attitude/Demeanor/Rapport: Engaged Affect (typically observed): Accepting Orientation: : Oriented to Self, Oriented to Place, Oriented to  Time, Oriented to Situation      Admission diagnosis:  Stroke (cerebrum) [facility]) [I63.9] Patient Active Problem List   Diagnosis Date Noted  . Essential hypertension 03/17/2020  . Hyperlipidemia 03/17/2020  . Cigarette smoker 03/17/2020  . Obesity 03/17/2020  . Snores 03/17/2020  . Stroke (cerebrum) ([place]) tiny B infart d/t intracranial atherosclerosis 03/15/2020  . Acute appendicitis 11/04/2017  . Acute appendicitis, uncomplicated   . Chest pain 07/21/2017   PCP:  Lucianne Lei, MD Pharmacy:   [facility], [place] [address] AT [address] [name] [address] Phone: [phone] Fax: [phone]     Social Determinants of Health (  SDOH) Interventions    Readmission Risk Interventions Readmission Risk Prevention Plan 03/17/2020  Post Dischage Appt Complete  Medication Screening Complete  Transportation Screening Complete  Some recent data might be hidden   Reinaldo Raddle, RN, BSN  Trauma/Neuro ICU Case Manager [phone]

## 2020-03-19 ENCOUNTER — Ambulatory Visit (HOSPITAL_COMMUNITY): Payer: 59 | Attending: Nurse Practitioner | Admitting: Physical Therapy

## 2020-03-19 ENCOUNTER — Encounter (HOSPITAL_COMMUNITY): Payer: 59 | Admitting: Occupational Therapy

## 2020-03-19 ENCOUNTER — Other Ambulatory Visit: Payer: Self-pay

## 2020-03-19 DIAGNOSIS — R41841 Cognitive communication deficit: Secondary | ICD-10-CM | POA: Insufficient documentation

## 2020-03-19 DIAGNOSIS — R262 Difficulty in walking, not elsewhere classified: Secondary | ICD-10-CM | POA: Insufficient documentation

## 2020-03-19 DIAGNOSIS — M6281 Muscle weakness (generalized): Secondary | ICD-10-CM | POA: Diagnosis present

## 2020-03-19 NOTE — Therapy (Signed)
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 Chronic back pain   . DDD (degenerative disc disease), cervical   . Hypertension   . Sciatica     Past Surgical History:  Procedure Laterality Date  . COLONOSCOPY N/A 07/11/2018   Procedure: COLONOSCOPY;  Surgeon: Daneil Dolin, MD;  [facility];  Service: Endoscopy;  Laterality: N/A;  9:30  . LAPAROSCOPIC APPENDECTOMY N/A 11/04/2017   Procedure: APPENDECTOMY LAPAROSCOPIC;  Surgeon: Aviva Signs, MD;  Location: [facility] ORS;  Service: General;  Laterality: N/A;  . POLYPECTOMY  07/11/2018   Procedure: POLYPECTOMY;  Surgeon: Daneil Dolin, MD;  Location: [facility];  Service: Endoscopy;;  sigmoid    There were no vitals filed for this visit.    Subjective Assessment - 03/19/20 1053    Subjective Patient reports he had a stroke on 03/15/20 with left sided weakness. He was using a walker at the hospital but now he is just using a cane as needed for left foot pain. States he feels his balance is fine and his strength is getting better. States last Tuesday he sprained his right ankle but that doesn't seem to be bothering him and now his left foot is painful about 8/10. Movement bothers it. Icyhot helps it.    Pertinent History HTN    Currently in Pain? Yes    Pain Score 8     Pain  Location Foot    Pain Orientation Left    Pain Descriptors / Indicators Burning;Aching    Pain Type Acute pain              [facility] PT Assessment - 03/19/20 0001      Assessment   Medical Diagnosis CVA    Referring Provider (PT) Burnetta Sabin, NP    Onset Date/Surgical Date 03/15/20    Prior Therapy at hospital - acute      Restrictions   Weight Bearing Restrictions No      Balance Screen   Has the patient fallen in the past 6 months No      [place] residence    Living Arrangements Spouse/significant other;Children    Available Help at Discharge Family    Type of [place] to enter    Entrance Stairs-Number of Steps [place] One level    [place] - single point;Walker - 2 wheels      Prior Function   Level of Independence Independent    Vocation --   not currently working    Leisure yard work       Observation/Other Assessments  Focus on Therapeutic Outcomes (FOTO)  40% function      ROM / Strength   AROM / PROM / Strength Strength      Strength   Strength Assessment Site Hip;Knee;Ankle    Right/Left Hip Right;Left    Right Hip Flexion 5/5    Left Hip Flexion 5/5    Right/Left Knee Right;Left    Right Knee Flexion 4+/5    Right Knee Extension 5/5    Left Knee Flexion 4+/5    Left Knee Extension 5/5    Right/Left Ankle Right;Left    Right Ankle Dorsiflexion 5/5    Left Ankle Dorsiflexion 4+/5   limited ROM against gravity     Ambulation/Gait   Ambulation/Gait Yes    Ambulation/Gait Assistance 7: Independent    Ambulation Distance (Feet) 322 Feet    Assistive device None    Gait Pattern Step-through pattern    Ambulation Surface Level;Indoor    Gait velocity WNL    Gait Comments 2MW      Balance   Balance Assessed Yes      Static Standing Balance   Static Standing - Comment/# of Minutes R SLS  needed occ UE assist 15 seconds, L SLS 14  seconds no UE assist      Standardized Balance Assessment   Standardized Balance Assessment Dynamic Gait Index      Dynamic Gait Index   Level Surface Normal    Change in Gait Speed Normal    Gait with Horizontal Head Turns Normal    Gait with Vertical Head Turns Normal    Gait and Pivot Turn Normal    Step Over Obstacle Normal    Step Around Obstacles Normal    Steps Normal    Total Score 24                      Objective measurements completed on examination: See above findings.       [place] Adult PT Treatment/Exercise - 03/19/20 0001      Exercises   Exercises Ankle      Ankle Exercises: Seated   Other Seated Ankle Exercises ankle, foot and toe ROM exercises - both passive for stretching and active - toe curls/ext, inv/eversion, DF/PF- 6 minutes                  PT Education - 03/19/20 1050    Education Details educated paitent in current condition, walking program, balance and strength testing results    Person(s) Educated Patient    Methods Explanation    Comprehension Verbalized understanding            PT Short Term Goals - 03/19/20 1051      PT SHORT TERM GOAL #1   Title Patient will be independent in self management strategies to improve quality of life and functional outcomes.    Time 1    Period Weeks    Status New    Target Date 03/26/20                     Plan - 03/19/20 1052    Clinical Impression Statement Patient presents to physical therapy for evaluation following CVA that increased weakness and sensation on the left side. Patient presents with food strength, balance and ambulatory mobility. Primary complaint at this time is left foot pain. After 2 minute walk patient reports his foot pain reduced to 4/10 pain. Educated patient on walking program for improved walking  endurance and heart health. Also educated patient in foot mobility exercises which also decreased patient's symptoms in his foot. At this time  patient does not require physical therapy. One time visit only.    Personal Factors and Comorbidities Comorbidity 1    Comorbidities HTN    Examination-Activity Limitations Locomotion Level    Examination-Participation Restrictions Community Activity;Yard Work    Stability/Clinical Decision Making Stable/Uncomplicated    Clinical Decision Making Low    Rehab Potential Excellent    PT Frequency One time visit    PT Treatment/Interventions ADLs/Self Care Home Management;Therapeutic exercise    PT Next Visit Plan no PT required at this time    PT Home Exercise Plan ankle, foot and toe ROM exercises, walking program    Consulted and Agree with Plan of Care Patient           Patient will benefit from skilled therapeutic intervention in order to improve the following deficits and impairments:  Abnormal gait, Decreased endurance, Pain, Decreased strength, Decreased activity tolerance, Difficulty walking, Decreased mobility  Visit Diagnosis: Muscle weakness (generalized)  Difficulty walking     Problem List Patient Active Problem List   Diagnosis Date Noted  . Essential hypertension 03/17/2020  . Hyperlipidemia 03/17/2020  . Cigarette smoker 03/17/2020  . Obesity 03/17/2020  . Snores 03/17/2020  . 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 Acute appendicitis 11/04/2017  . Acute appendicitis, uncomplicated   . Chest pain 07/21/2017   10:58 AM, 03/19/20 Jerene Pitch, DPT Physical Therapy with [facility]  [phone] office  [place] [address] [place], [name], [address] Phone: [phone]   Fax:  [phone]  Name: Carl Garza MRN: [id number] Date of Birth: [birth year]

## 2020-03-23 ENCOUNTER — Encounter (HOSPITAL_COMMUNITY): Payer: Self-pay | Admitting: Speech Pathology

## 2020-03-23 ENCOUNTER — Other Ambulatory Visit: Payer: Self-pay

## 2020-03-23 ENCOUNTER — Ambulatory Visit (HOSPITAL_COMMUNITY): Payer: 59 | Admitting: Speech Pathology

## 2020-03-23 DIAGNOSIS — M6281 Muscle weakness (generalized): Secondary | ICD-10-CM | POA: Diagnosis not present

## 2020-03-23 DIAGNOSIS — R41841 Cognitive communication deficit: Secondary | ICD-10-CM

## 2020-03-23 NOTE — Therapy (Signed)
[place], [name], [address] Phone: [phone]   Fax:  [phone]  Speech Language Pathology Evaluation  Patient Details  Name: Carl Garza MRN: [id number] Date of Birth: August 23, 1956 Referring Provider (SLP): Burnetta Sabin, NP   Encounter Date: 03/23/2020   End of Session - 03/23/20 1359    Visit Number 1    Number of Visits 1    Authorization Type [employer]    SLP Start Time [id number]    SLP Stop Time  7412    SLP Time Calculation (min) 51 min    Activity Tolerance Patient tolerated treatment well           Past Medical History:  Diagnosis Date  . Chronic back pain   . DDD (degenerative disc disease), cervical   . Hypertension   . Sciatica     Past Surgical History:  Procedure Laterality Date  . COLONOSCOPY N/A 07/11/2018   Procedure: COLONOSCOPY;  Surgeon: Daneil Dolin, MD;  [facility];  Service: Endoscopy;  Laterality: N/A;  9:30  . LAPAROSCOPIC APPENDECTOMY N/A 11/04/2017   Procedure: APPENDECTOMY LAPAROSCOPIC;  Surgeon: Aviva Signs, MD;  Location: [facility];  Service: General;  Laterality: N/A;  . POLYPECTOMY  07/11/2018   Procedure: POLYPECTOMY;  Surgeon: Daneil Dolin, MD;  [facility];  Service: Endoscopy;;  sigmoid    There were no vitals filed for this visit.   Subjective Assessment - 03/23/20 1053    Subjective "I get to stuttering a little bit."    Patient is accompained by: Family member    Special Tests SLUMS and other informal measures    Currently in Pain? No/denies              SLP Evaluation [facility] - 03/23/20 1053      SLP Visit Information   SLP Received On 03/23/20    Referring Provider (SLP) Burnetta Sabin, NP    Onset Date 03/15/2020    Medical Diagnosis CVA      General Information   HPI  64 y.o. male with a PMHx of HTN, HLD, chronic back pain, cervical DDD and sciatica, who [facility]/20 with chief complaints  of sudden onset dizziness, left facial droop and LUE weakness, concerning for right CVA. tPA was started and he was transferred to [facility] for post-tPA management and full stroke work up. He was referred for outpatient SLP evaluation by Burnetta Sabin, NP.    Behavioral/Cognition alert and cooperative    Mobility Status ambulatory      Balance Screen   Has the patient fallen in the past 6 months No    Has the patient had a decrease in activity level because of a fear of falling?  No    Is the patient reluctant to leave their home because of a fear of falling?  No      Prior Functional Status   Cognitive/Linguistic Baseline Within functional limits   Although his wife oversees medications and bills prior to CV   Type of Home House     Lives With Spouse;Son    Available Support Family    Education some of 12th grade    Vocation Unemployed      Pain Assessment   Pain Assessment No/denies pain      Cognition   Overall Cognitive Status Impaired/Different from baseline    Area of Impairment Attention    Current Attention Level Sustained  Attention Sustained    Sustained Attention Impaired    Sustained Attention Impairment Verbal complex;Functional complex    Memory Appears intact    Awareness Appears intact    Problem Solving Appears intact      Auditory Comprehension   Overall Auditory Comprehension Appears within functional limits for tasks assessed    Yes/No Questions Within Functional Limits    Commands Within Functional Limits    Conversation Complex    Interfering Components Attention    EffectiveTechniques Repetition      Visual Recognition/Discrimination   Discrimination Within Function Limits      Reading Comprehension   Reading Status --   WFL for single words, Pt reports limited reading     Expression   Primary Mode of Expression Verbal      Verbal Expression   Overall Verbal Expression Appears within functional limits for tasks assessed    Initiation No impairment      Automatic Speech Name;Social Response;Month of year    Level of Generative/Spontaneous Verbalization Conversation    Repetition No impairment    Naming No impairment    Pragmatics No impairment    Interfering Components Attention    Non-Verbal Means of Communication Not applicable      Written Expression   Dominant Hand Right    Written Expression Not tested      Oral Motor/Sensory Function   Overall Oral Motor/Sensory Function Appears within functional limits for tasks assessed      Motor Speech   Overall Motor Speech Appears within functional limits for tasks assessed    Respiration Within functional limits    Phonation Normal    Resonance Within functional limits    Articulation Within functional limitis    Intelligibility Intelligible    Motor Planning Witnin functional limits    Motor Speech Errors Not applicable    Phonation WFL      Standardized Assessments   Standardized Assessments  --   SLUMS 22/30, BNT short 12/15            SLP Education - 03/23/20 1356    Education Details Pt and spouse provided attention, memory, and word finding strategies; Pt and wife pleased with current level of functioning    Person(s) Educated Patient;Spouse    Methods Explanation;Handout    Comprehension Verbalized understanding            SLP Short Term Goals - 03/23/20 1401      SLP SHORT TERM GOAL #1   Title N/A; evaluation only              Plan - 03/23/20 1400    Clinical Impression Statement Pt seen for SLE and was administered the SLUMS, BNT short form, and other informal measures to evaluate his current cognitive linguistic skills. Pt scored a 22/30 (normal is 25+ for less than high school education) with impairments in attention, mental calculations, and working memory (min impairment). He scored 12/15 on the BNT short form, however both he and his wife indicate that he would have scored the same prior to his stroke. Expressive language skills are WNL. Pt does  indicate that he notes some "stuttering" when he gets excited, however this was not exhibited today. SLP suggested short term therapy to address attention deficits and provide strategies, however Pt and his wife report feeling satisfied with his current level of functioning (wife says he is "slightly" still off but better every day) and do not wish to pursue SLP therapy at this time.  Pt was given memory, attention, and word finding strategies to take home and was encouraged to contact me should they change their mind. He was advised to use short "to do" lists and to complete each item before going to the next. His wife assisted with medication management and bill paying before his stroke and plans to continue. Their 34 yo son also lives at home with them. His main goal is to resume driving and he was encouraged to discuss with his doctor. In short, Pt presents with mild cognitive changes s/p acute CVA with deficits in attention which negatively impacts working memory, mental calculations (could complete with pencil and paper), and auditory comprehension/recall of paragraph length material. He benefits from repetition and allowance for extra time. Pt and wife are pleased with current level of function and are hopeful that Pt will continue to progress at home without therapy. No further SLP services indicated at this time.     Consulted and Agree with Plan of Care Patient;Family member/caregiver    Family Member Consulted Spouse           Patient will benefit from skilled therapeutic intervention in order to improve the following deficits and impairments:   Cognitive communication deficit    Problem List Patient Active Problem List   Diagnosis Date Noted  . Essential hypertension 03/17/2020  . Hyperlipidemia 03/17/2020  . Cigarette smoker 03/17/2020  . Obesity 03/17/2020  . Snores 03/17/2020  . 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 Acute appendicitis  11/04/2017  . Acute appendicitis, uncomplicated   . Chest pain 07/21/2017   Thank you,  Genene Churn, [place]  [facility] 03/23/2020, 2:02 PM  [place], [name], [address] Phone: [phone]   Fax:  [phone]  Name: Carl Garza MRN: [id number] Date of Birth: 08/23/1956

## 2020-03-24 ENCOUNTER — Encounter (HOSPITAL_COMMUNITY): Payer: 59 | Admitting: Specialist

## 2020-03-27 ENCOUNTER — Ambulatory Visit (HOSPITAL_COMMUNITY): Payer: 59 | Attending: Nurse Practitioner | Admitting: Specialist

## 2020-03-27 ENCOUNTER — Encounter (HOSPITAL_COMMUNITY): Payer: Self-pay | Admitting: Specialist

## 2020-03-27 ENCOUNTER — Other Ambulatory Visit: Payer: Self-pay

## 2020-03-27 DIAGNOSIS — M25512 Pain in left shoulder: Secondary | ICD-10-CM | POA: Diagnosis present

## 2020-03-27 NOTE — Patient Instructions (Signed)

## 2020-03-27 NOTE — Therapy (Signed)
[place], [name], [address] Phone: [phone]   Fax:  [phone]  Occupational Therapy Evaluation  Patient Details  Name: Carl Garza MRN: [id number] Date of Birth: 08/31/1956 Referring Provider (OT): Burnetta Sabin, NP   Encounter Date: 03/27/2020   OT End of Session - 03/27/20 1319    Visit Number 1    Number of Visits 1    Date for OT Re-Evaluation 03/27/20    Authorization Type [employer]           Past Medical History:  Diagnosis Date  . Chronic back pain   . DDD (degenerative disc disease), cervical   . Hypertension   . Sciatica     Past Surgical History:  Procedure Laterality Date  . COLONOSCOPY N/A 07/11/2018   Procedure: COLONOSCOPY;  Surgeon: Daneil Dolin, MD;  [facility];  Service: Endoscopy;  Laterality: N/A;  9:30  . LAPAROSCOPIC APPENDECTOMY N/A 11/04/2017   Procedure: APPENDECTOMY LAPAROSCOPIC;  Surgeon: Aviva Signs, MD;  Location: [facility];  Service: General;  Laterality: N/A;  . POLYPECTOMY  07/11/2018   Procedure: POLYPECTOMY;  Surgeon: Daneil Dolin, MD;  [facility];  Service: Endoscopy;;  sigmoid    There were no vitals filed for this visit.   Subjective Assessment - 03/27/20 1315    Subjective  S:  I am doing great!    Patient is accompanied by: Family member    Pertinent History 64 y.o. male with a PMHx of HTN, HLD, chronic back pain, cervical DDD and sciatica, who [facility]/20 with chief complaints of sudden onset dizziness, left facial droop and LUE weakness, concerning for right CVA. tPA was started and he was transferred to [facility] for post-tPA management and full stroke work up. He was referred for outpatient OT by Burnetta Sabin, NP    Patient Stated Goals get back to driving    Currently in Pain? Yes    Pain Score 1     Pain Location Shoulder    Pain Orientation Left    Pain Descriptors / Indicators Aching     Pain Type Acute pain    Pain Onset More than a month ago    Pain Frequency Intermittent    Aggravating Factors  covid vaccine    Pain Relieving Factors n/a    Effect of Pain on Daily Activities minimal             [facility] OT Assessment - 03/27/20 0001      Assessment   Medical Diagnosis CVA    Referring Provider (OT) Burnetta Sabin, NP    Onset Date/Surgical Date 03/15/20    Prior Therapy at hospital - acute      Restrictions   Weight Bearing Restrictions No      Balance Screen   Has the patient fallen in the past 6 months No    Has the patient had a decrease in activity level because of a fear of falling?  No    Is the patient reluctant to leave their home because of a fear of falling?  No      Prior Function   Level of Independence Independent    Vocation Unemployed    Leisure yard work       ADL   ADL comments Independent except for driving and mowing, which he hopes to return to in the next few weeks      Written  Expression   Dominant Hand Right      Vision - History   Baseline Vision No visual deficits      Cognition   Overall Cognitive Status Within Functional Limits for tasks assessed      Sensation   Light Touch Appears Intact      Coordination   Gross Motor Movements are Fluid and Coordinated Yes    Fine Motor Movements are Fluid and Coordinated Yes      ROM / Strength   AROM / PROM / Strength AROM;Strength      AROM   Overall AROM Comments BUE A/ROM is WNL      Strength   Overall Strength Comments BUE strength is 5/5      Hand Function   Right Hand Grip (lbs) 80    Right Hand Lateral Pinch 22 lbs    Right Hand 3 Point Pinch 18 lbs    Left Hand Grip (lbs) 85    Left Hand Lateral Pinch 24 lbs    Left 3 point pinch 18 lbs                           OT Education - 03/27/20 1317    Education Details educated patient on shoulder stretches for left shoulder    Person(s) Educated Patient    Methods Explanation    Comprehension  Verbalized understanding;Returned demonstration            OT Short Term Goals - 03/27/20 1321      OT SHORT TERM GOAL #1   Title Patient will be educated and independent with HEP for shoulder stretches.    Time 1    Period Days    Status Achieved    Target Date 03/27/20                    Plan - 03/27/20 1319    Clinical Impression Statement A:   64 y.o. male with a PMHx of HTN, HLD, chronic back pain, cervical DDD and sciatica, who [facility]/20 with chief complaints of sudden onset dizziness, left facial droop and LUE weakness, concerning for right CVA. tPA was started and he was transferred to [facility] for post-tPA management and full stroke work up. He was referred for outpatient OT for evalaution and treatment.  Patient is functioning at PLOF except for driving and mowing, which he plans to return to in the near future.  BUE A/ROM, strength, coordination, sensation are all WNL.    OT Occupational Profile and History Problem Focused Assessment - Including review of records relating to presenting problem    Occupational performance deficits (Please refer to evaluation for details): ADL's    Body Structure / Function / Physical Skills Pain    Rehab Potential Excellent    Clinical Decision Making Limited treatment options, no task modification necessary    Comorbidities Affecting Occupational Performance: None    Modification or Assistance to Complete Evaluation  No modification of tasks or assist necessary to complete eval    OT Frequency One time visit    OT Treatment/Interventions Patient/family education    Plan P: Educated on HEP for stretching of left shoulder, no skilled intervention indicated, dc from ot services this date.    Consulted and Agree with Plan of Care Patient           Patient will benefit from skilled therapeutic intervention in order to improve the  following deficits and impairments:   Body Structure / Function / Physical  Skills: Pain       Visit Diagnosis: Acute pain of left shoulder - Plan: Ot plan of care cert/re-cert    Problem List Patient Active Problem List   Diagnosis Date Noted  . Essential hypertension 03/17/2020  . Hyperlipidemia 03/17/2020  . Cigarette smoker 03/17/2020  . Obesity 03/17/2020  . Snores 03/17/2020  . 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 Acute appendicitis 11/04/2017  . Acute appendicitis, uncomplicated   . Chest pain 07/21/2017    Vangie Bicker, [place], OTR/L [phone]  03/27/2020, 1:25 PM  [place], [name], [address] Phone: [phone]   Fax:  [phone]  Name: Carl Garza MRN: [id number] Date of Birth: December 15, 1955

## 2020-04-23 ENCOUNTER — Other Ambulatory Visit: Payer: Self-pay

## 2020-04-23 ENCOUNTER — Ambulatory Visit (HOSPITAL_COMMUNITY)
Admission: RE | Admit: 2020-04-23 | Discharge: 2020-04-23 | Disposition: A | Discharge status: Home / Self Care | Payer: 59 | Source: Ambulatory Visit | Attending: Family Medicine | Admitting: Family Medicine

## 2020-04-23 ENCOUNTER — Other Ambulatory Visit (HOSPITAL_COMMUNITY): Payer: Self-pay | Admitting: Family Medicine

## 2020-04-23 DIAGNOSIS — R059 Cough, unspecified: Secondary | ICD-10-CM

## 2020-04-23 DIAGNOSIS — R0789 Other chest pain: Secondary | ICD-10-CM | POA: Diagnosis not present

## 2020-04-23 DIAGNOSIS — R05 Cough: Secondary | ICD-10-CM | POA: Insufficient documentation

## 2020-04-27 ENCOUNTER — Encounter: Payer: Self-pay | Admitting: Adult Health

## 2020-04-27 ENCOUNTER — Ambulatory Visit (INDEPENDENT_AMBULATORY_CARE_PROVIDER_SITE_OTHER): Payer: 59 | Admitting: Adult Health

## 2020-04-27 VITALS — BP 106/70 | HR 76 | Ht 68.0 in | Wt 205.2 lb

## 2020-04-27 DIAGNOSIS — Z9189 Other specified personal risk factors, not elsewhere classified: Secondary | ICD-10-CM | POA: Diagnosis not present

## 2020-04-27 DIAGNOSIS — I10 Essential (primary) hypertension: Secondary | ICD-10-CM

## 2020-04-27 DIAGNOSIS — I639 Cerebral infarction, unspecified: Secondary | ICD-10-CM | POA: Diagnosis not present

## 2020-04-27 DIAGNOSIS — E785 Hyperlipidemia, unspecified: Secondary | ICD-10-CM | POA: Diagnosis not present

## 2020-04-27 NOTE — Progress Notes (Signed)
[facility] [address] [place] [address] [phone]       HOSPITAL FOLLOW UP NOTE  [name] Date of Birth:  02/22/1956 Medical Record Number:  [id number]   Reason for Referral:  hospital stroke follow up    SUBJECTIVE:   CHIEF COMPLAINT:  Chief Complaint  Patient presents with  . Hospitalization Follow-up    Rm 5, with wife.     HPI:   CarlCarl L Tottenis a 64 y.o.malewith history of HTN, HLD, chronic back pain, cervical DDD and sciatica who presented on 03/15/2020 to [facility] ED with L facial droop and L sided weakness.  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  Recommended DAPT for 3 months and aspirin alone.  History of HTN stable long-term BP goal normotensive range.  LDL 118 initiate atorvastatin 80 mg daily.  Other stroke risk factors include current tobacco use, EtOH use (alcohol level 101), obesity and questionable sleep apnea.  Residual deficits of left lower facial weakness and left hemiparesis and recommended OP PT/OT/SLP and discharged home in stable condition.  Stroke: Tiny right brain infarct not visualized on MRI and small L parietal subacuteinfarct s/p tPA secondary tolarge vessel disease  Code Stroke CT head No acute abnormality. Small vessel disease. Atrophy. ASPECTS 10.   CT head repeat no change  CTA head & neck no LVO. Severe proximal L M1, moderate to severe B A2 and P2, extensive moderate to severe multifocal distal MCA branches stenoses. ICA siphons w/ R>L stenoses. Origins of great vessels w/ atherosclerosis. Ascending aorta dilitation up to 4cm  MRI Subcentimeter L parietal infarct  2D EchoEF 70-75% w/ severe LVH. No source of embolus   LDL119  HgbA1c5.8  No antithromboticprior to admission, given large vessel stenosis, added DAPT x 3 months then aspirin  alone  Therapy recommendations: HH PT, HH OT,SLPsupervision -> OP therapy ok, wife to provide transportation  Disposition: Return home  Today, 04/27/2020, Carl Garza is being seen for hospital follow-up accompanied by his wife. He has recovered well from a stroke standpoint with residual very mild decreased right hand dexterity and intermittent generalized headaches.  Evaluated by therapy and cleared with recommended HEP.  Return back to all prior activities without difficulty.  No prior history of headaches or migraines.  Headaches typically present with increased stressors and not debilitating and subside with use of Tylenol.  No associated symptoms such as photophobia, phonophobia or nausea/vomiting.  Questionable apnea during admission reporting occasional snoring, daytime fatigue and awakening frequently throughout the night.  He has not previously been evaluated for sleep apnea.  Continues on DAPT and atorvastatin without side effects.  Blood pressure today 106/70.  He reports complete tobacco and alcohol cessation since discharge.  No concerns at this time.    ROS:   14 system review of systems performed and negative with exception of see HPI  PMH:  Past Medical History:  Diagnosis Date  . Chronic back pain   . DDD (degenerative disc disease), cervical   . Hypertension   . Sciatica     PSH:  Past Surgical History:  Procedure Laterality Date  . COLONOSCOPY N/A 07/11/2018   Procedure: COLONOSCOPY;  Surgeon: Daneil Dolin, MD;  Location: [facility];  Service: Endoscopy;  Laterality: N/A;  9:30  . LAPAROSCOPIC APPENDECTOMY N/A 11/04/2017   Procedure: APPENDECTOMY LAPAROSCOPIC;  Surgeon: Aviva Signs, MD;  Location: [facility] ORS;  Service: General;  Laterality: N/A;  . POLYPECTOMY  07/11/2018   Procedure: POLYPECTOMY;  Surgeon: Daneil Dolin, MD;  Location: [facility];  Service: Endoscopy;;  sigmoid    Social History:  Social History   Socioeconomic History  . Marital  status: Married    Spouse name: Not on file  . Number of children: Not on file  . Years of education: Not on file  . Highest education level: Not on file  Occupational History  . Not on file  Tobacco Use  . Smoking status: Current Every Day Smoker    Packs/day: 0.50    Types: Cigarettes  . Smokeless tobacco: Never Used  Vaping Use  . Vaping Use: Never used  Substance and Sexual Activity  . Alcohol use: Yes    Comment: on football days  . Drug use: No  . Sexual activity: Not on file  Other Topics Concern  . Not on file  Social History Narrative  . Not on file   Social Determinants of Health   Financial Resource Strain:   . Difficulty of Paying Living Expenses:   Food Insecurity:   . Worried About Charity fundraiser in the Last Year:   . Arboriculturist in the Last Year:   Transportation Needs:   . Film/video editor (Medical):   Marland Kitchen Lack of Transportation (Non-Medical):   Physical Activity:   . Days of Exercise per Week:   . Minutes of Exercise per Session:   Stress:   . Feeling of Stress :   Social Connections:   . Frequency of Communication with Friends and Family:   . Frequency of Social Gatherings with Friends and Family:   . Attends Religious Services:   . Active Member of Clubs or Organizations:   . Attends Archivist Meetings:   Marland Kitchen Marital Status:   Intimate Partner Violence:   . Fear of Current or Ex-Partner:   . Emotionally Abused:   Marland Kitchen Physically Abused:   . Sexually Abused:     Family History:  Family History  Problem Relation Age of Onset  . Asthma Father     Medications:   Current Outpatient Medications on File Prior to Visit  Medication Sig Dispense Refill  . amLODipine (NORVASC) 10 MG tablet Take 10 mg by mouth every evening.   2  . aspirin EC 81 MG EC tablet Take 1 tablet (81 mg total) by mouth daily. Swallow whole. 30 tablet 11  . atorvastatin (LIPITOR) 80 MG tablet Take 1 tablet (80 mg total) by mouth daily at 6 PM. 30 tablet  2  . clopidogrel (PLAVIX) 75 MG tablet Take 1 tablet (75 mg total) by mouth daily. After 90 days, take aspirin alone 90 tablet 0  . gabapentin (NEURONTIN) 300 MG capsule TAKE 1 CAPSULE(300 MG) BY MOUTH THREE TIMES DAILY 90 capsule 5  . Multiple Vitamin (MULTIVITAMIN WITH MINERALS) TABS tablet Take 1 tablet by mouth every morning.     . nicotine (NICODERM CQ - DOSED IN MG/24 HOURS) 14 mg/24hr patch Place 1 patch (14 mg total) onto the skin daily. 28 patch 0  . potassium chloride SA (KLOR-CON) 20 MEQ tablet Take 20 mEq by mouth every morning.     . valsartan-hydrochlorothiazide (DIOVAN-HCT) 160-25 MG tablet Take 1 tablet by mouth every morning.      No current facility-administered medications on file prior to visit.    Allergies:  No Known Allergies    OBJECTIVE:  Physical Exam  Vitals:   04/27/20 1507  BP: 106/70  Pulse: 76  Weight: 205 lb 3.2 oz (93.1 kg)  Height: 5\' 8"  (1.727 m)   Body mass index is 31.2 kg/m. No exam data present  General: well developed, well nourished, pleasant middle-aged African-American male, seated, in no evident distress Head: head normocephalic and atraumatic.   Neck: supple with no carotid or supraclavicular bruits Cardiovascular: regular rate and rhythm, no murmurs Musculoskeletal: no deformity Skin:  no rash/petichiae Vascular:  Normal pulses all extremities   Neurologic Exam Mental Status: Awake and fully alert.   Fluent speech and language.  Oriented to place and time. Recent and remote memory intact. Attention span, concentration and fund of knowledge appropriate. Mood and affect appropriate.  Cranial Nerves: Fundoscopic exam reveals sharp disc margins. Pupils equal, briskly reactive to light. Extraocular movements full without nystagmus. Visual fields full to confrontation. Hearing intact. Facial sensation intact. Face, tongue, palate moves normally and symmetrically.  Motor: Normal bulk and tone. Normal strength in all tested extremity  muscles except mild decreased right hand motor control. Sensory.: intact to touch , pinprick , position and vibratory sensation.  Coordination: Rapid alternating movements normal in all extremities except slightly decreased right hand. Finger-to-nose and heel-to-shin performed accurately bilaterally. Gait and Station: Arises from chair without difficulty. Stance is normal. Gait demonstrates normal stride length and balance without use of assistive device Reflexes: 1+ and symmetric. Toes downgoing.     NIHSS  0 Modified Rankin  1     ASSESSMENT: DYLEN MCELHANNON is a 64 y.o. 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 Vascular risk factors include intracranial stenosis, HTN, HLD, tobacco use, EtOH use and questionable sleep apnea.      PLAN:  1. L parietal and right brain stroke:  -Residual deficit: Mild decrease right hand dexterity and intermittent headaches.  Ongoing improvement and encouraged daily HEP for further recovery.  Post stroke headache improving and are not debilitating.  Recommend continued monitoring for likely ongoing improvement.   -Continue aspirin 81 mg daily and clopidogrel 75 mg daily  and atorvastatin 80 mg daily for secondary stroke prevention.  Complete 3 months DAPT and then aspirin alone (around 06/17/2020).   -Close PCP follow up for aggressive stroke risk factor management  2. HTN: BP goal <130/90. Continue f/u with PCP 3. HLD: LDL goal <70. Recent LDL 119.  Continue atorvastatin 80 mg daily; f/u with PCP monitoring of lipid panel as well as prescribing of statin 4. Questionable sleep apnea: Declines interest in referral for further evaluation.  Educated on increased risk in regards to recurrent stroke and cardiovascular disease with untreated sleep apnea and  patient verbalized understanding.  Additional information provided in AVS and he will call office if interested in pursuing evaluation 5. Hx of tobacco abuse and EtOH use: Reports complete cessation and congratulated on accomplishment.  Encouraged continued cessation for secondary stroke prevention    Follow up in 6 months or call earlier if needed   I spent 45 minutes of face-to-face and non-face-to-face time with patient and wife.  This included previsit chart review, lab review, study review, order entry, electronic health record documentation, patient education regarding recent stroke, residual deficits, importance of managing stroke risk factors and answered all questions to patient satisfaction     Frann Rider,  AGNP-BC  [facility] [address] [place], [place] [address]  Phone [phone] Fax [phone] Note: This document was prepared with digital dictation and possible smart phrase technology. Any transcriptional errors that result from this process are unintentional.

## 2020-04-27 NOTE — Patient Instructions (Addendum)
Continue aspirin 81 mg daily and clopidogrel 75 mg daily  and lipitor 80mg   for secondary stroke prevention  Discontinue plavix after 90 days (once prescription runs out)  Continue to follow up with PCP regarding cholesterol and blood pressure management  Maintain strict control of hypertension with blood pressure goal below 130/90 and cholesterol with LDL cholesterol (bad cholesterol) goal below 70 mg/dL.      Followup in the future with me in 6 months or call earlier if needed       Thank you for coming to see [place] at [facility]. I hope we have been able to provide you high quality care today.  You may receive a patient satisfaction survey over the next few weeks. We would appreciate your feedback and comments so that we may continue to improve ourselves and the health of our patients.    Sleep Apnea Sleep apnea is a condition in which breathing pauses or becomes shallow during sleep. Episodes of sleep apnea usually last 10 seconds or longer, and they may occur as many as 20 times an hour. Sleep apnea disrupts your sleep and keeps your body from getting the rest that it needs. This condition can increase your risk of certain health problems, including:  Heart attack.  Stroke.  Obesity.  Diabetes.  Heart failure.  Irregular heartbeat. What are the causes? There are three kinds of sleep apnea:  Obstructive sleep apnea. This kind is caused by a blocked or collapsed airway.  Central sleep apnea. This kind happens when the part of the brain that controls breathing does not send the correct signals to the muscles that control breathing.  Mixed sleep apnea. This is a combination of obstructive and central sleep apnea. The most common cause of this condition is a collapsed or blocked airway. An airway can collapse or become blocked if:  Your throat muscles are abnormally relaxed.  Your tongue and tonsils are larger than normal.  You are  overweight.  Your airway is smaller than normal. What increases the risk? You are more likely to develop this condition if you:  Are overweight.  Smoke.  Have a smaller than normal airway.  Are elderly.  Are male.  Drink alcohol.  Take sedatives or tranquilizers.  Have a family history of sleep apnea. What are the signs or symptoms? Symptoms of this condition include:  Trouble staying asleep.  Daytime sleepiness and tiredness.  Irritability.  Loud snoring.  Morning headaches.  Trouble concentrating.  Forgetfulness.  Decreased interest in sex.  Unexplained sleepiness.  Mood swings.  Personality changes.  Feelings of depression.  Waking up often during the night to urinate.  Dry mouth.  Sore throat. How is this diagnosed? This condition may be diagnosed with:  A medical history.  A physical exam.  A series of tests that are done while you are sleeping (sleep study). These tests are usually done in a sleep lab, but they may also be done at home. How is this treated? Treatment for this condition aims to restore normal breathing and to ease symptoms during sleep. It may involve managing health issues that can affect breathing, such as high blood pressure or obesity. Treatment may include:  Sleeping on your side.  Using a decongestant if you have nasal congestion.  Avoiding the use of depressants, including alcohol, sedatives, and narcotics.  Losing weight if you are overweight.  Making changes to your diet.  Quitting smoking.  Using a device to open your airway while you sleep,  such as: ? An oral appliance. This is a custom-made mouthpiece that shifts your lower jaw forward. ? A continuous positive airway pressure (CPAP) device. This device blows air through a mask when you breathe out (exhale). ? A nasal expiratory positive airway pressure (EPAP) device. This device has valves that you put into each nostril. ? A bi-level positive airway  pressure (BPAP) device. This device blows air through a mask when you breathe in (inhale) and breathe out (exhale).  Having surgery if other treatments do not work. During surgery, excess tissue is removed to create a wider airway. It is important to get treatment for sleep apnea. Without treatment, this condition can lead to:  High blood pressure.  Coronary artery disease.  In men, an inability to achieve or maintain an erection (impotence).  Reduced thinking abilities. Follow these instructions at home: Lifestyle  Make any lifestyle changes that your health care provider recommends.  Eat a healthy, well-balanced diet.  Take steps to lose weight if you are overweight.  Avoid using depressants, including alcohol, sedatives, and narcotics.  Do not use any products that contain nicotine or tobacco, such as cigarettes, e-cigarettes, and chewing tobacco. If you need help quitting, ask your health care provider. General instructions  Take over-the-counter and prescription medicines only as told by your health care provider.  If you were given a device to open your airway while you sleep, use it only as told by your health care provider.  If you are having surgery, make sure to tell your health care provider you have sleep apnea. You may need to bring your device with you.  Keep all follow-up visits as told by your health care provider. This is important. Contact a health care provider if:  The device that you received to open your airway during sleep is uncomfortable or does not seem to be working.  Your symptoms do not improve.  Your symptoms get worse. Get help right away if:  You develop: ? Chest pain. ? Shortness of breath. ? Discomfort in your back, arms, or stomach.  You have: ? Trouble speaking. ? Weakness on one side of your body. ? Drooping in your face. These symptoms may represent a serious problem that is an emergency. Do not wait to see if the symptoms will  go away. Get medical help right away. Call your local emergency services (911 in the [place] Do not drive yourself to the hospital. Summary  Sleep apnea is a condition in which breathing pauses or becomes shallow during sleep.  The most common cause is a collapsed or blocked airway.  The goal of treatment is to restore normal breathing and to ease symptoms during sleep. This information is not intended to replace advice given to you by your health care provider. Make sure you discuss any questions you have with your health care provider. Document Revised: 02/27/2019 Document Reviewed: 05/08/2018 Elsevier Patient Education  [employer].

## 2020-04-27 NOTE — Progress Notes (Signed)
I agree with the above plan 

## 2020-05-02 ENCOUNTER — Other Ambulatory Visit: Payer: Self-pay | Admitting: Orthopedic Surgery

## 2020-06-09 ENCOUNTER — Other Ambulatory Visit: Payer: Self-pay

## 2020-06-09 NOTE — Patient Outreach (Signed)
[facility]) Care Management  06/09/2020  Carl Garza Dec 20, 1955 [id number]  Telephone outreach to patient to obtain mRS was successfully completed. MRS=0  [place] Management Assistant [phone]

## 2020-09-26 DIAGNOSIS — I639 Cerebral infarction, unspecified: Secondary | ICD-10-CM

## 2020-09-26 HISTORY — DX: Cerebral infarction, unspecified: I63.9

## 2020-10-26 ENCOUNTER — Other Ambulatory Visit: Payer: Self-pay | Admitting: Orthopedic Surgery

## 2020-10-28 ENCOUNTER — Ambulatory Visit: Payer: 59 | Admitting: Adult Health

## 2021-04-24 ENCOUNTER — Other Ambulatory Visit: Payer: Self-pay | Admitting: Orthopedic Surgery

## 2021-04-28 ENCOUNTER — Encounter (HOSPITAL_COMMUNITY): Payer: Self-pay | Admitting: Physical Therapy

## 2021-04-28 ENCOUNTER — Ambulatory Visit (HOSPITAL_COMMUNITY): Payer: 59 | Attending: Neurological Surgery | Admitting: Physical Therapy

## 2021-04-28 ENCOUNTER — Other Ambulatory Visit: Payer: Self-pay

## 2021-04-28 DIAGNOSIS — R2689 Other abnormalities of gait and mobility: Secondary | ICD-10-CM | POA: Insufficient documentation

## 2021-04-28 DIAGNOSIS — R29898 Other symptoms and signs involving the musculoskeletal system: Secondary | ICD-10-CM | POA: Insufficient documentation

## 2021-04-28 DIAGNOSIS — M545 Low back pain, unspecified: Secondary | ICD-10-CM | POA: Insufficient documentation

## 2021-04-28 DIAGNOSIS — M6281 Muscle weakness (generalized): Secondary | ICD-10-CM | POA: Diagnosis present

## 2021-04-28 NOTE — Patient Instructions (Signed)
Access Code: [email or web address] URL: [email or web address] Date: 04/28/2021 Prepared by: Mitzi Hansen Rayelynn Loyal  Exercises Supine Bridge - 2 x daily - 7 x weekly - 2 sets - 10 reps

## 2021-04-28 NOTE — Therapy (Signed)
[place], [name], [address] Phone: [phone]   Fax:  [phone]  Physical Therapy Evaluation  Patient Details  Name: Carl Garza MRN: [id number] Date of Birth: 1956-08-07 Referring Provider (PT): Pieter Partridge Dawley DO   Encounter Date: 04/28/2021   PT End of Session - 04/28/21 0950     Visit Number 1    Number of Visits 12    Date for PT Re-Evaluation 06/09/21    Authorization Type [employer] (no auth, vl 20)    Authorization - Visit Number 1    Authorization - Number of Visits 20    Progress Note Due on Visit 10    PT Start Time 0917    PT Stop Time 0949    PT Time Calculation (min) 32 min    Activity Tolerance Patient tolerated treatment well    Behavior During Therapy [facility] for tasks assessed/performed             Past Medical History:  Diagnosis Date   Chronic back pain    DDD (degenerative disc disease), cervical    Hypertension    Sciatica     Past Surgical History:  Procedure Laterality Date   COLONOSCOPY N/A 07/11/2018   Procedure: COLONOSCOPY;  Surgeon: Daneil Dolin, MD;  [facility];  Service: Endoscopy;  Laterality: N/A;  9:30   LAPAROSCOPIC APPENDECTOMY N/A 11/04/2017   Procedure: APPENDECTOMY LAPAROSCOPIC;  Surgeon: Aviva Signs, MD;  Location: [facility];  Service: General;  Laterality: N/A;   POLYPECTOMY  07/11/2018   Procedure: POLYPECTOMY;  Surgeon: Daneil Dolin, MD;  [facility]: [facility];  Service: Endoscopy;;  sigmoid    There were no vitals filed for this visit.    Subjective Assessment - 04/28/21 0920     Subjective Patient is a 65 y.o. male who presents to physical therapy with c/o chronic LBP. Patient states pain began about 2-3 years ago with insidious onset. Patient states increases pain with movement. Has not found anything that makes it better. Pain is constant. He has tried meds, creams with no relief. Patient states his main goal is to decrease his back  pain. He states he used to have pain in his legs but not lately. He has arthritis in his back.    Limitations Lifting;Standing;Walking;House hold activities    How long can you walk comfortably? 5 minutes    Patient Stated Goals decrease pain    Currently in Pain? Yes    Pain Score 8     Pain Location Back    Pain Orientation Lower    Pain Descriptors / Indicators Aching   hurts   Pain Type Chronic pain    Pain Onset More than a month ago    Pain Frequency Constant    Aggravating Factors  movement    Pain Relieving Factors none                [facility] PT Assessment - 04/28/21 0001       Assessment   Medical Diagnosis Chronic LBP    Referring Provider (PT) Pieter Partridge Dawley DO    Onset Date/Surgical Date 04/28/18    Next MD Visit September    Prior Therapy none      Precautions   Precautions None      Restrictions   Weight Bearing Restrictions No      Balance Screen   Has the patient fallen in the past 6 months No  Has the patient had a decrease in activity level because of a fear of falling?  No    Is the patient reluctant to leave their home because of a fear of falling?  No      Prior Function   Level of Independence Independent    Leisure walking      Cognition   Overall Cognitive Status Within Functional Limits for tasks assessed      Observation/Other Assessments   Observations Ambulates without AD    Focus on Therapeutic Outcomes (FOTO)  56% function      Posture/Postural Control   Posture/Postural Control Postural limitations    Postural Limitations Rounded Shoulders;Forward head;Increased thoracic kyphosis      ROM / Strength   AROM / PROM / Strength AROM;Strength      AROM   Overall AROM Comments pain with all arom    AROM Assessment Site Lumbar    Lumbar Flexion 25% limited    Lumbar Extension 50% limited    Lumbar - Right Side Bend 25% limited    Lumbar - Left Side Bend 25% limited    Lumbar - Right Rotation 25% limited    Lumbar - Left  Rotation 25% limited      Strength   Strength Assessment Site Hip;Knee;Ankle    Right/Left Hip Right;Left    Right Hip Flexion 4+/5    Left Hip Flexion 4+/5    Right/Left Knee Right;Left    Right Knee Flexion 5/5    Right Knee Extension 5/5    Left Knee Flexion 5/5    Left Knee Extension 5/5    Right/Left Ankle Right;Left    Right Ankle Dorsiflexion 5/5    Left Ankle Dorsiflexion 5/5      Palpation   Spinal mobility hypomobile thoracic and lumbar    Palpation comment TTP lower lumbar paraspinals, no tenderness otherwise      Transfers   Five time sit to stand comments  17.50 seconds without UE support, first attempt lost balance      Ambulation/Gait   Ambulation/Gait Yes    Ambulation Distance (Feet) 410 Feet    Assistive device None    Ambulation Surface Level;Indoor    Gait Comments 2MWT, gradual increase in LBP                        Objective measurements completed on examination: See above findings.       [facility] Adult PT Treatment/Exercise - 04/28/21 0001       Exercises   Exercises Lumbar      Lumbar Exercises: Stretches   Prone on Elbows Stretch 3 reps;20 seconds;30 seconds    Prone on Elbows Stretch Limitations no change in symptoms    Other Lumbar Stretch Exercise lumbar extension repeated x 10 - no change      Lumbar Exercises: Supine   Bridge 10 reps    Bridge Limitations with cueing for glute activation                    PT Education - 04/28/21 0919     Education Details Patient educated on exam findings, POC, scope of PT, HEP    Person(s) Educated Patient    Methods Explanation;Handout    Comprehension Verbalized understanding              PT Short Term Goals - 04/28/21 0953       PT SHORT TERM GOAL #1   Title Patient  will be independent with HEP in order to improve functional outcomes.    Time 3    Period Weeks    Status New    Target Date 05/19/21      PT SHORT TERM GOAL #2   Title Patient will report  at least 25% improvement in symptoms for improved quality of life.    Time 3    Period Weeks    Status New    Target Date 05/19/21               PT Long Term Goals - 04/28/21 0953       PT LONG TERM GOAL #1   Title Patient will report at least 75% improvement in symptoms for improved quality of life.    Time 6    Period Weeks    Status New    Target Date 06/09/21      PT LONG TERM GOAL #2   Title Patient will improve FOTO score by at least 12 points in order to indicate improved tolerance to activity.    Time 6    Period Weeks    Status New    Target Date 06/09/21      PT LONG TERM GOAL #3   Title Patient will be able to complete 5x STS in under 11.4 seconds in order to reduce the risk of falls.    Time 6    Period Weeks    Status New    Target Date 06/09/21      PT LONG TERM GOAL #4   Title Patient will be able to ambulate at least 450 feet in 2MWT with no increase in symptoms in order to demonstrate improved gait speed for community ambulation.    Time 6    Period Weeks    Status New    Target Date 06/09/21                    Plan - 04/28/21 0950     Clinical Impression Statement Patient is a 65 y.o. male who presents to physical therapy with c/o chronic LBP. He presents with pain limited deficits in lumbar strength, ROM, endurance, postural impairments, spinal mobility and functional mobility with ADL. He is having to modify and restrict ADL as indicated by FOTO score as well as subjective information and objective measures which is affecting overall participation. Patient with no change in symptoms following extension repeated. Patient given cueing for glute activation with bridge and completes well. Patient will benefit from skilled physical therapy in order to improve function and reduce impairment.    Personal Factors and Comorbidities Comorbidity 3+;Fitness;Past/Current Experience;Time since onset of injury/illness/exacerbation    Comorbidities  chronic LBP, HTN, hx CVA    Examination-Activity Limitations Locomotion Level;Transfers;Stairs;Stand;Squat    Examination-Participation Restrictions Meal Prep;Cleaning;Shop;Volunteer;Dorita Sciara    Stability/Clinical Decision Making Stable/Uncomplicated    Clinical Decision Making Low    Rehab Potential Fair    PT Frequency 2x / week    PT Duration 6 weeks    PT Treatment/Interventions ADLs/Self Care Home Management;Aquatic Therapy;Electrical Stimulation;Iontophoresis '4mg'$ /ml Dexamethasone;Moist Heat;Traction;Ultrasound;Parrafin;DME Instruction;Cryotherapy;Gait training;Stair training;Functional mobility training;Therapeutic exercise;Therapeutic activities;Balance training;Patient/family education;Neuromuscular re-education;Manual techniques;Compression bandaging;Passive range of motion;Dry needling;Energy conservation;Splinting;Taping;Spinal Manipulations;Joint Manipulations    PT Next Visit Plan core and hip strengthening, lumbar mobility    PT Home Exercise Plan 8/3 bridge    Consulted and Agree with Plan of Care Patient             Patient will benefit from  skilled therapeutic intervention in order to improve the following deficits and impairments:  Difficulty walking, Decreased range of motion, Decreased endurance, Increased muscle spasms, Decreased activity tolerance, Pain, Impaired flexibility, Improper body mechanics, Postural dysfunction, Decreased strength, Decreased mobility  Visit Diagnosis: Low back pain, unspecified back pain laterality, unspecified chronicity, unspecified whether sciatica present  Muscle weakness (generalized)  Other abnormalities of gait and mobility  Other symptoms and signs involving the musculoskeletal system     Problem List Patient Active Problem List   Diagnosis Date Noted   Essential hypertension 03/17/2020   Hyperlipidemia 03/17/2020   Cigarette smoker 03/17/2020   Obesity 03/17/2020   Snores 03/17/2020   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

## 2021-04-30 ENCOUNTER — Ambulatory Visit (HOSPITAL_COMMUNITY): Payer: 59

## 2021-04-30 ENCOUNTER — Other Ambulatory Visit: Payer: Self-pay

## 2021-04-30 ENCOUNTER — Encounter (HOSPITAL_COMMUNITY): Payer: Self-pay

## 2021-04-30 DIAGNOSIS — R2689 Other abnormalities of gait and mobility: Secondary | ICD-10-CM

## 2021-04-30 DIAGNOSIS — M6281 Muscle weakness (generalized): Secondary | ICD-10-CM

## 2021-04-30 DIAGNOSIS — M545 Low back pain, unspecified: Secondary | ICD-10-CM | POA: Diagnosis not present

## 2021-04-30 DIAGNOSIS — R29898 Other symptoms and signs involving the musculoskeletal system: Secondary | ICD-10-CM

## 2021-04-30 NOTE — Patient Instructions (Signed)
Isometric Abdominal    Lying on back with knees bent, tighten stomach by pressing elbows down. Hold 5 seconds. Be sure NOT TO HOLD BREATH! Repeat 10 times per set. Do 2 sets per day.  http://orth.exer.us/1086   Copyright  VHI. All rights reserved.   Isometric Abdominal Contraction    Tuck in stomach muscles and push low back against back of chair. Hold 5-10 seconds while counting out loud. Repeat 10 times. Do 2 sessions per day.  http://gt2.exer.us/623   Copyright  VHI. All rights reserved.     Press-Up    Press upper body upward, keeping hips in contact with floor. Keep lower back and buttocks relaxed. Hold 10 seconds. Repeat 5 times per set. Do 2 sets per day.  http://orth.exer.us/94   Copyright  VHI. All rights reserved.   Heel Squeeze (Prone)    Abdomen supported, bend knees and gently squeeze heels together. Hold 5 seconds. Repeat 10 times per set. Do 2 sets per day.  http://orth.exer.us/1080   Copyright  VHI. All rights reserved.

## 2021-04-30 NOTE — Therapy (Signed)
[place], [name], [address] Phone: [phone]   Fax:  [phone]  Physical Therapy Treatment  Patient Details  Name: Carl Garza MRN: [id number] Date of Birth: 05/27/1956 Referring Provider (PT): Pieter Partridge Dawley DO   Encounter Date: 04/30/2021   PT End of Session - 04/30/21 1139     Visit Number 2    Number of Visits 12    Date for PT Re-Evaluation 06/09/21    Authorization Type [employer] (no auth, vl 20)    Authorization - Visit Number 2    Authorization - Number of Visits 20    Progress Note Due on Visit 10    PT Start Time 1136    PT Stop Time 1217    PT Time Calculation (min) 41 min    Activity Tolerance Patient tolerated treatment well    Behavior During Therapy [facility] for tasks assessed/performed             Past Medical History:  Diagnosis Date   Chronic back pain    DDD (degenerative disc disease), cervical    Hypertension    Sciatica     Past Surgical History:  Procedure Laterality Date   COLONOSCOPY N/A 07/11/2018   Procedure: COLONOSCOPY;  Surgeon: Daneil Dolin, MD;  [facility];  Service: Endoscopy;  Laterality: N/A;  9:30   LAPAROSCOPIC APPENDECTOMY N/A 11/04/2017   Procedure: APPENDECTOMY LAPAROSCOPIC;  Surgeon: Aviva Signs, MD;  Location: [facility];  Service: General;  Laterality: N/A;   POLYPECTOMY  07/11/2018   Procedure: POLYPECTOMY;  Surgeon: Daneil Dolin, MD;  [facility];  Service: Endoscopy;;  sigmoid    There were no vitals filed for this visit.   Subjective Assessment - 04/30/21 1138     Subjective Pt stated he has constant nagging pain that increases with movement.    Patient Stated Goals decrease pain    Currently in Pain? Yes    Pain Score 7     Pain Location Back    Pain Orientation Lower    Pain Descriptors / Indicators Nagging    Pain Type Chronic pain    Pain Onset More than a month ago    Pain Frequency Constant    Aggravating  Factors  movement    Pain Relieving Factors none    Effect of Pain on Daily Activities limits                [facility] PT Assessment - 04/30/21 0001       Assessment   Medical Diagnosis Chronic LBP    Referring Provider (PT) Pieter Partridge Dawley DO    Onset Date/Surgical Date 04/28/18    Next MD Visit September    Prior Therapy none      Precautions   Precautions None      Strength   Right Hip Extension 3+/5    Right Hip ABduction 4+/5    Left Hip Extension 3/5    Left Hip ABduction 4+/5                           [facility] Adult PT Treatment/Exercise - 04/30/21 0001       Posture/Postural Control   Posture/Postural Control Postural limitations    Postural Limitations Rounded Shoulders;Forward head;Increased thoracic kyphosis      Exercises   Exercises Lumbar      Lumbar Exercises: Stretches   Lower Trunk Rotation 5 reps;10  seconds    Prone on Elbows Stretch 1 rep    Prone on Elbows Stretch Limitations 2 min    Press Ups 5 reps;10 seconds      Lumbar Exercises: Seated   Other Seated Lumbar Exercises ab sets 5x 5"      Lumbar Exercises: Supine   AB Set Limitations 65mn tactile and verbal cueing to improve; paired wiht exhale    Bent Knee Raise 20 reps;3 seconds    Bent Knee Raise Limitations alternating wiht ab set    Bridge 10 reps    Bridge Limitations 2 sets; ab sets to reduce lordacic curvature wiht bridge      Lumbar Exercises: Prone   Other Prone Lumbar Exercises heel squeeze 10x 5"                    PT Education - 04/30/21 1147     Education Details Reviewed goals, educated importance of HEP compliance for maximal benefits    Methods Explanation;Demonstration    Comprehension Verbalized understanding;Returned demonstration;Verbal cues required              PT Short Term Goals - 04/28/21 0953       PT SHORT TERM GOAL #1   Title Patient will be independent with HEP in order to improve functional outcomes.    Time 3     Period Weeks    Status New    Target Date 05/19/21      PT SHORT TERM GOAL #2   Title Patient will report at least 25% improvement in symptoms for improved quality of life.    Time 3    Period Weeks    Status New    Target Date 05/19/21               PT Long Term Goals - 04/28/21 0953       PT LONG TERM GOAL #1   Title Patient will report at least 75% improvement in symptoms for improved quality of life.    Time 6    Period Weeks    Status New    Target Date 06/09/21      PT LONG TERM GOAL #2   Title Patient will improve FOTO score by at least 12 points in order to indicate improved tolerance to activity.    Time 6    Period Weeks    Status New    Target Date 06/09/21      PT LONG TERM GOAL #3   Title Patient will be able to complete 5x STS in under 11.4 seconds in order to reduce the risk of falls.    Time 6    Period Weeks    Status New    Target Date 06/09/21      PT LONG TERM GOAL #4   Title Patient will be able to ambulate at least 450 feet in 2MWT with no increase in symptoms in order to demonstrate improved gait speed for community ambulation.    Time 6    Period Weeks    Status New    Target Date 06/09/21                   Plan - 04/30/21 1149     Clinical Impression Statement Reviewed goals, educated importance of HEP compliance for maximal benefits.  Began session educating on importance of core strengthening to support back.  NMR to improve breathing and exhale paired wiht abdominal sets to reduce holding  breath.  Cueing for stability wiht core strenghtening exericses.  Noted significant weakness with hip extension, added heel squeeze for gluteal strengthening.  EOS pt reports pain reduced.    Personal Factors and Comorbidities Comorbidity 3+;Fitness;Past/Current Experience;Time since onset of injury/illness/exacerbation    Comorbidities chronic LBP, HTN, hx CVA    Examination-Activity Limitations Locomotion  Level;Transfers;Stairs;Stand;Squat    Examination-Participation Restrictions Meal Prep;Cleaning;Shop;Volunteer;Dorita Sciara    Stability/Clinical Decision Making Stable/Uncomplicated    Clinical Decision Making Low    Rehab Potential Fair    PT Frequency 2x / week    PT Duration 6 weeks    PT Treatment/Interventions ADLs/Self Care Home Management;Aquatic Therapy;Electrical Stimulation;Iontophoresis '4mg'$ /ml Dexamethasone;Moist Heat;Traction;Ultrasound;Parrafin;DME Instruction;Cryotherapy;Gait training;Stair training;Functional mobility training;Therapeutic exercise;Therapeutic activities;Balance training;Patient/family education;Neuromuscular re-education;Manual techniques;Compression bandaging;Passive range of motion;Dry needling;Energy conservation;Splinting;Taping;Spinal Manipulations;Joint Manipulations    PT Next Visit Plan core and hip strengthening, lumbar mobility    PT Home Exercise Plan 8/3 bridge; 04/30/21: ab sets, heel squeeze, prone press ups    Consulted and Agree with Plan of Care Patient             Patient will benefit from skilled therapeutic intervention in order to improve the following deficits and impairments:  Difficulty walking, Decreased range of motion, Decreased endurance, Increased muscle spasms, Decreased activity tolerance, Pain, Impaired flexibility, Improper body mechanics, Postural dysfunction, Decreased strength, Decreased mobility  Visit Diagnosis: Low back pain, unspecified back pain laterality, unspecified chronicity, unspecified whether sciatica present  Muscle weakness (generalized)  Other abnormalities of gait and mobility  Other symptoms and signs involving the musculoskeletal system     Problem List Patient Active Problem List   Diagnosis Date Noted   Essential hypertension 03/17/2020   Hyperlipidemia 03/17/2020   Cigarette smoker 03/17/2020   Obesity 03/17/2020   Snores 03/17/2020   Stroke (cerebrum) ([place]) tiny B infart d/t  intracranial atherosclerosis 03/15/2020   Acute appendicitis 11/04/2017   Acute appendicitis, uncomplicated    Chest pain 07/21/2017   Ihor Austin, LPTA/CLT; CBIS [phone]  Aldona Lento 04/30/2021, 12:30 PM  [facility] [address] [place], [name], [address] Phone: [phone]   Fax:  [phone]  Name: Carl Garza MRN: [id number] Date of Birth: May 20, 1956

## 2021-05-07 ENCOUNTER — Ambulatory Visit (HOSPITAL_COMMUNITY): Payer: 59 | Admitting: Physical Therapy

## 2021-05-07 ENCOUNTER — Encounter (HOSPITAL_COMMUNITY): Payer: Self-pay | Admitting: Physical Therapy

## 2021-05-07 ENCOUNTER — Other Ambulatory Visit: Payer: Self-pay

## 2021-05-07 DIAGNOSIS — M545 Low back pain, unspecified: Secondary | ICD-10-CM

## 2021-05-07 DIAGNOSIS — M6281 Muscle weakness (generalized): Secondary | ICD-10-CM

## 2021-05-07 DIAGNOSIS — R2689 Other abnormalities of gait and mobility: Secondary | ICD-10-CM

## 2021-05-07 DIAGNOSIS — R29898 Other symptoms and signs involving the musculoskeletal system: Secondary | ICD-10-CM

## 2021-05-07 NOTE — Therapy (Signed)
[name], [name], [address] Phone: [phone]   Fax:  [phone]  Physical Therapy Treatment  Patient Details  Name: Carl Garza MRN: [id number] Date of Birth: [birth year] Referring Provider (PT): Pieter Partridge Dawley DO   Encounter Date: 05/07/2021   PT End of Session - 05/07/21 1315     Visit Number 3    Number of Visits 12    Date for PT Re-Evaluation 06/09/21    Authorization Type [employer] (no auth, vl 20)    Authorization - Visit Number 3    Authorization - Number of Visits 20    Progress Note Due on Visit 10    PT Start Time [id number]    PT Stop Time 1355    PT Time Calculation (min) 38 min    Activity Tolerance Patient tolerated treatment well    Behavior During Therapy [facility] for tasks assessed/performed             Past Medical History:  Diagnosis Date   Chronic back pain    DDD (degenerative disc disease), cervical    Hypertension    Sciatica     Past Surgical History:  Procedure Laterality Date   COLONOSCOPY N/A 07/11/2018   Procedure: COLONOSCOPY;  Surgeon: Daneil Dolin, MD;  [facility];  Service: Endoscopy;  Laterality: N/A;  9:30   LAPAROSCOPIC APPENDECTOMY N/A 11/04/2017   Procedure: APPENDECTOMY LAPAROSCOPIC;  Surgeon: Aviva Signs, MD;  Location: [facility];  Service: General;  Laterality: N/A;   POLYPECTOMY  07/11/2018   Procedure: POLYPECTOMY;  Surgeon: Daneil Dolin, MD;  [facility];  Service: Endoscopy;;  sigmoid    There were no vitals filed for this visit.   Subjective Assessment - 05/07/21 1322     Subjective Reports 5/10 pain in your back. States he is feeling better. States that he does his exercises daily at night and sometimes it makes it feel better and sometimes it doesn't.    Patient Stated Goals decrease pain    Currently in Pain? Yes    Pain Score 5     Pain Location Back    Pain Orientation Lower;Left    Pain Descriptors / Indicators Aching     Pain Type Chronic pain    Pain Onset More than a month ago                [facility] PT Assessment - 05/07/21 0001       Assessment   Medical Diagnosis Chronic LBP    Referring Provider (PT) Pieter Partridge Dawley DO    Onset Date/Surgical Date 04/28/18                           [facility] Adult PT Treatment/Exercise - 05/07/21 0001       Lumbar Exercises: Stretches   Double Knee to Chest Stretch --   3 minutes on green ball   Lower Trunk Rotation --   painful, stopped - 1.5 minutes on ball     Lumbar Exercises: Standing   Other Standing Lumbar Exercises hip abd 3x10 UE support      Lumbar Exercises: Supine   AB Set Limitations 5 minutes with tactile and verbal cues and posterior tilt- cues to breath throughout.    Bridge 15 reps;2 seconds   2 sets   Other Supine Lumbar Exercises deadbugs - painful - stopped      Lumbar Exercises: Prone  Straight Leg Raise 5 reps;2 seconds   5 sets bilateral   Other Prone Lumbar Exercises POE x15    Other Prone Lumbar Exercises hamstring curl 2x10 5" holds B                      PT Short Term Goals - 04/28/21 [id number]       PT SHORT TERM GOAL #1   Title Patient will be independent with HEP in order to improve functional outcomes.    Time 3    Period Weeks    Status New    Target Date 05/19/21      PT SHORT TERM GOAL #2   Title Patient will report at least 25% improvement in symptoms for improved quality of life.    Time 3    Period Weeks    Status New    Target Date 05/19/21               PT Long Term Goals - 04/28/21 0953       PT LONG TERM GOAL #1   Title Patient will report at least 75% improvement in symptoms for improved quality of life.    Time 6    Period Weeks    Status New    Target Date 06/09/21      PT LONG TERM GOAL #2   Title Patient will improve FOTO score by at least 12 points in order to indicate improved tolerance to activity.    Time 6    Period Weeks    Status New    Target  Date 06/09/21      PT LONG TERM GOAL #3   Title Patient will be able to complete 5x STS in under 11.4 seconds in order to reduce the risk of falls.    Time 6    Period Weeks    Status New    Target Date 06/09/21      PT LONG TERM GOAL #4   Title Patient will be able to ambulate at least 450 feet in 2MWT with no increase in symptoms in order to demonstrate improved gait speed for community ambulation.    Time 6    Period Weeks    Status New    Target Date 06/09/21                   Plan - 05/07/21 1346     Clinical Impression Statement Rotational movements continue to increase pain in lumbar spine of patient. Verbal and tactile cues throughout session secondary to patient having tendency to rush through movements quickly without focusing on form. Patient with pain with exercises he could not perform with good form even with verbal and tactile cues. Added hip extension to HEP secondary to no pain during exercises. Cues to keep hips and lumbar spine from rotating during this exercise. Will continue to progress core strength as tolerated.    Personal Factors and Comorbidities Comorbidity 3+;Fitness;Past/Current Experience;Time since onset of injury/illness/exacerbation    Comorbidities chronic LBP, HTN, hx CVA    Examination-Activity Limitations Locomotion Level;Transfers;Stairs;Stand;Squat    Examination-Participation Restrictions Meal Prep;Cleaning;Shop;Volunteer;Valla Leaver [facility]    Stability/Clinical Decision Making Stable/Uncomplicated    Rehab Potential Fair    PT Frequency 2x / week    PT Duration 6 weeks    PT Treatment/Interventions ADLs/Self Care Home Management;Aquatic Therapy;Electrical Stimulation;Iontophoresis '4mg'$ /ml Dexamethasone;Moist Heat;Traction;Ultrasound;Parrafin;DME Instruction;Cryotherapy;Gait training;Stair training;Functional mobility training;Therapeutic exercise;Therapeutic activities;Balance training;Patient/family education;Neuromuscular  re-education;Manual techniques;Compression bandaging;Passive range of motion;Dry needling;Energy conservation;Splinting;Taping;Spinal  Manipulations;Joint Manipulations    PT Next Visit Plan core and hip strengthening, lumbar mobility    PT Home Exercise Plan 8/3 bridge; 04/30/21: ab sets, heel squeeze, prone press ups; 8/12 hip extension    Consulted and Agree with Plan of Care Patient             Patient will benefit from skilled therapeutic intervention in order to improve the following deficits and impairments:  Difficulty walking, Decreased range of motion, Decreased endurance, Increased muscle spasms, Decreased activity tolerance, Pain, Impaired flexibility, Improper body mechanics, Postural dysfunction, Decreased strength, Decreased mobility  Visit Diagnosis: Low back pain, unspecified back pain laterality, unspecified chronicity, unspecified whether sciatica present  Muscle weakness (generalized)  Other abnormalities of gait and mobility  Other symptoms and signs involving the musculoskeletal system     Problem List Patient Active Problem List   Diagnosis Date Noted   Essential hypertension 03/17/2020   Hyperlipidemia 03/17/2020   Cigarette smoker 03/17/2020   Obesity 03/17/2020   Snores 03/17/2020   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 [place], [name], [address] Phone: [phone]   Fax:  [phone]  Name: Carl Garza MRN: [id number] Date of Birth: 1956/06/02

## 2021-05-11 ENCOUNTER — Encounter (HOSPITAL_COMMUNITY): Payer: Self-pay | Admitting: Physical Therapy

## 2021-05-11 ENCOUNTER — Ambulatory Visit (HOSPITAL_COMMUNITY): Payer: 59 | Admitting: Physical Therapy

## 2021-05-11 ENCOUNTER — Other Ambulatory Visit: Payer: Self-pay

## 2021-05-11 DIAGNOSIS — R2689 Other abnormalities of gait and mobility: Secondary | ICD-10-CM

## 2021-05-11 DIAGNOSIS — M545 Low back pain, unspecified: Secondary | ICD-10-CM

## 2021-05-11 DIAGNOSIS — R29898 Other symptoms and signs involving the musculoskeletal system: Secondary | ICD-10-CM

## 2021-05-11 DIAGNOSIS — M6281 Muscle weakness (generalized): Secondary | ICD-10-CM

## 2021-05-11 NOTE — Therapy (Signed)
[place], [name], [address] Phone: [phone]   Fax:  [phone]  Physical Therapy Treatment  Patient Details  Name: Carl Garza MRN: [id number] Date of Birth: 1956/01/01 Referring Provider (PT): Pieter Partridge Dawley DO   Encounter Date: 05/11/2021   PT End of Session - 05/11/21 1609     Visit Number 4    Number of Visits 12    Date for PT Re-Evaluation 06/09/21    Authorization Type [employer] (no auth, vl 20)    Authorization - Visit Number 4    Authorization - Number of Visits 20    Progress Note Due on Visit 10    PT Start Time 1605    PT Stop Time 1645    PT Time Calculation (min) 40 min    Activity Tolerance Patient tolerated treatment well    Behavior During Therapy [facility] for tasks assessed/performed             Past Medical History:  Diagnosis Date   Chronic back pain    DDD (degenerative disc disease), cervical    Hypertension    Sciatica     Past Surgical History:  Procedure Laterality Date   COLONOSCOPY N/A 07/11/2018   Procedure: COLONOSCOPY;  Surgeon: Daneil Dolin, MD;  [facility];  Service: Endoscopy;  Laterality: N/A;  9:30   LAPAROSCOPIC APPENDECTOMY N/A 11/04/2017   Procedure: APPENDECTOMY LAPAROSCOPIC;  Surgeon: Aviva Signs, MD;  Location: [facility];  Service: General;  Laterality: N/A;   POLYPECTOMY  07/11/2018   Procedure: POLYPECTOMY;  Surgeon: Daneil Dolin, MD;  [facility];  Service: Endoscopy;;  sigmoid    There were no vitals filed for this visit.   Subjective Assessment - 05/11/21 1609     Subjective Patient states things are improving. Back is feeling better. He thinks the exercises are helping. Pain is about a 5 today.    Patient Stated Goals decrease pain    Currently in Pain? Yes    Pain Score 5     Pain Location Back    Pain Orientation Lower;Posterior    Pain Descriptors / Indicators Aching    Pain Type Chronic pain    Pain Onset More  than a month ago                               [facility] Adult PT Treatment/Exercise - 05/11/21 0001       Lumbar Exercises: Stretches   Prone on Elbows Stretch 3 reps;60 seconds    Press Ups 20 reps   2 x 10, (improved symptoms)     Lumbar Exercises: Standing   Row 20 reps;Theraband    Theraband Level (Row) Level 4 (Blue)    Shoulder Extension 20 reps;Theraband    Theraband Level (Shoulder Extension) Level 4 (Blue)    Other Standing Lumbar Exercises palloff press BTB x15 each      Lumbar Exercises: Supine   Ab Set 10 reps;5 seconds    Bent Knee Raise 20 reps    Dead Bug 20 reps   2 x 10   Bridge 20 reps    Straight Leg Raise 20 reps    Straight Leg Raises Limitations with ab brace                      PT Short Term Goals - 04/28/21 [id number]  PT SHORT TERM GOAL #1   Title Patient will be independent with HEP in order to improve functional outcomes.    Time 3    Period Weeks    Status New    Target Date 05/19/21      PT SHORT TERM GOAL #2   Title Patient will report at least 25% improvement in symptoms for improved quality of life.    Time 3    Period Weeks    Status New    Target Date 05/19/21               PT Long Term Goals - 04/28/21 0953       PT LONG TERM GOAL #1   Title Patient will report at least 75% improvement in symptoms for improved quality of life.    Time 6    Period Weeks    Status New    Target Date 06/09/21      PT LONG TERM GOAL #2   Title Patient will improve FOTO score by at least 12 points in order to indicate improved tolerance to activity.    Time 6    Period Weeks    Status New    Target Date 06/09/21      PT LONG TERM GOAL #3   Title Patient will be able to complete 5x STS in under 11.4 seconds in order to reduce the risk of falls.    Time 6    Period Weeks    Status New    Target Date 06/09/21      PT LONG TERM GOAL #4   Title Patient will be able to ambulate at least 450 feet in 2MWT  with no increase in symptoms in order to demonstrate improved gait speed for community ambulation.    Time 6    Period Weeks    Status New    Target Date 06/09/21                   Plan - 05/11/21 1709     Clinical Impression Statement Patient tolerated ther ex progressions well today with no increased complaint of pain. Noting slight decrease in lumbar pain with extension based mat exercise. Progressed to band resisted postural strengthening with adding palloff press and band rows/ extensions. Patient required demo and verbal cues for proper form and body mechanics. Patient will continue to benefit from skilled therapy services to reduce deficits and improve functional ability.    Personal Factors and Comorbidities Comorbidity 3+;Fitness;Past/Current Experience;Time since onset of injury/illness/exacerbation    Comorbidities chronic LBP, HTN, hx CVA    Examination-Activity Limitations Locomotion Level;Transfers;Stairs;Stand;Squat    Examination-Participation Restrictions Meal Prep;Cleaning;Shop;Volunteer;Valla Leaver [facility]    Stability/Clinical Decision Making Stable/Uncomplicated    Rehab Potential Fair    PT Frequency 2x / week    PT Duration 6 weeks    PT Treatment/Interventions ADLs/Self Care Home Management;Aquatic Therapy;Electrical Stimulation;Iontophoresis '4mg'$ /ml Dexamethasone;Moist Heat;Traction;Ultrasound;Parrafin;DME Instruction;Cryotherapy;Gait training;Stair training;Functional mobility training;Therapeutic exercise;Therapeutic activities;Balance training;Patient/family education;Neuromuscular re-education;Manual techniques;Compression bandaging;Passive range of motion;Dry needling;Energy conservation;Splinting;Taping;Spinal Manipulations;Joint Manipulations    PT Next Visit Plan core and hip strengthening, lumbar mobility    PT Home Exercise Plan 8/3 bridge; 04/30/21: ab sets, heel squeeze, prone press ups; 8/12 hip extension 8/16 palloff press, band rows/ extension     Consulted and Agree with Plan of Care Patient             Patient will benefit from skilled therapeutic intervention in order to improve the following deficits and impairments:  Difficulty walking, Decreased range of motion, Decreased endurance, Increased muscle spasms, Decreased activity tolerance, Pain, Impaired flexibility, Improper body mechanics, Postural dysfunction, Decreased strength, Decreased mobility  Visit Diagnosis: Low back pain, unspecified back pain laterality, unspecified chronicity, unspecified whether sciatica present  Muscle weakness (generalized)  Other abnormalities of gait and mobility  Other symptoms and signs involving the musculoskeletal system     Problem List Patient Active Problem List   Diagnosis Date Noted   Essential hypertension 03/17/2020   Hyperlipidemia 03/17/2020   Cigarette smoker 03/17/2020   Obesity 03/17/2020   Snores 03/17/2020   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

## 2021-05-11 NOTE — Patient Instructions (Signed)
Access Code: [email or web address] URL: [email or web address] Date: 05/11/2021 Prepared by: Josue Hector  Exercises Standing Anti-Rotation Press with Anchored Resistance - 1-2 x daily - 7 x weekly - 2 sets - 10 reps Standing Shoulder Row with Anchored Resistance - 1-2 x daily - 7 x weekly - 2 sets - 10 reps Shoulder Extension with Resistance - 1-2 x daily - 7 x weekly - 2 sets - 10 reps

## 2021-05-13 ENCOUNTER — Encounter (HOSPITAL_COMMUNITY): Payer: Self-pay

## 2021-05-13 ENCOUNTER — Other Ambulatory Visit: Payer: Self-pay

## 2021-05-13 ENCOUNTER — Ambulatory Visit (HOSPITAL_COMMUNITY): Payer: 59

## 2021-05-13 DIAGNOSIS — R2689 Other abnormalities of gait and mobility: Secondary | ICD-10-CM

## 2021-05-13 DIAGNOSIS — M6281 Muscle weakness (generalized): Secondary | ICD-10-CM

## 2021-05-13 DIAGNOSIS — M545 Low back pain, unspecified: Secondary | ICD-10-CM

## 2021-05-13 DIAGNOSIS — R29898 Other symptoms and signs involving the musculoskeletal system: Secondary | ICD-10-CM

## 2021-05-13 NOTE — Therapy (Signed)
[place], [name], [address] Phone: [phone]   Fax:  [phone]  Physical Therapy Treatment  Patient Details  Name: Carl Garza MRN: [id number] Date of Birth: 03/16/1956 Referring Provider (PT): Pieter Partridge Dawley DO   Encounter Date: 05/13/2021   PT End of Session - 05/13/21 1050     Visit Number 5    Number of Visits 12    Date for PT Re-Evaluation 06/09/21    Authorization Type [employer] (no auth, vl 20)    Authorization - Visit Number 5    Authorization - Number of Visits 20    Progress Note Due on Visit 10    PT Start Time [id number]    PT Stop Time 1130    PT Time Calculation (min) 39 min    Activity Tolerance Patient tolerated treatment well    Behavior During Therapy [facility] for tasks assessed/performed             Past Medical History:  Diagnosis Date   Chronic back pain    DDD (degenerative disc disease), cervical    Hypertension    Sciatica     Past Surgical History:  Procedure Laterality Date   COLONOSCOPY N/A 07/11/2018   Procedure: COLONOSCOPY;  Surgeon: Daneil Dolin, MD;  [facility];  Service: Endoscopy;  Laterality: N/A;  9:30   LAPAROSCOPIC APPENDECTOMY N/A 11/04/2017   Procedure: APPENDECTOMY LAPAROSCOPIC;  Surgeon: Aviva Signs, MD;  Location: [facility];  Service: General;  Laterality: N/A;   POLYPECTOMY  07/11/2018   Procedure: POLYPECTOMY;  Surgeon: Daneil Dolin, MD;  [facility];  Service: Endoscopy;;  sigmoid    There were no vitals filed for this visit.   Subjective Assessment - 05/13/21 1049     Subjective Patient has been doing HEP. Pain aggravated after sitting or standing too long.    Patient Stated Goals decrease pain    Currently in Pain? Yes    Pain Score 4     Pain Location Back    Pain Orientation Lower;Medial    Pain Descriptors / Indicators Aching    Pain Onset More than a month ago                [facility] Adult PT  Treatment/Exercise - 05/13/21 0001       Self-Care   Self-Care Posture    Posture sitting and standing      Lumbar Exercises: Standing   Row 20 reps;Theraband    Theraband Level (Row) Level 4 (Blue)    Shoulder Extension 20 reps;Theraband    Theraband Level (Shoulder Extension) Level 4 (Blue)    Other Standing Lumbar Exercises palloff press BTB x15 each      Lumbar Exercises: Supine   Bridge with Ball Squeeze Non-compliant;10 reps;5 seconds    Bridge with [employer] Limitations orange 4 square ball    Other Supine Lumbar Exercises alternating arm/leg raise x10 5 sec hold      Lumbar Exercises: Prone   Opposite Arm/Leg Raise Right arm/Left leg;Left arm/Right leg;10 reps;2 seconds    Other Prone Lumbar Exercises POE 2 min; press ups x10; POE 2 min repeat                    PT Education - 05/13/21 1058     Education Details Discussed purpose and technique of interventions throughout session. Sitting and standing postures to reduce LBP.    Person(s) Educated Patient  Methods Explanation;Handout    Comprehension Verbalized understanding              PT Short Term Goals - 05/13/21 1050       PT SHORT TERM GOAL #1   Title Patient will be independent with HEP in order to improve functional outcomes.    Time 3    Period Weeks    Status Achieved    Target Date 05/19/21      PT SHORT TERM GOAL #2   Title Patient will report at least 25% improvement in symptoms for improved quality of life.    Time 3    Period Weeks    Status On-going    Target Date 05/19/21               PT Long Term Goals - 05/13/21 1050       PT LONG TERM GOAL #1   Title Patient will report at least 75% improvement in symptoms for improved quality of life.    Time 6    Period Weeks    Status On-going      PT LONG TERM GOAL #2   Title Patient will improve FOTO score by at least 12 points in order to indicate improved tolerance to activity.    Time 6    Period Weeks     Status On-going      PT LONG TERM GOAL #3   Title Patient will be able to complete 5x STS in under 11.4 seconds in order to reduce the risk of falls.    Time 6    Period Weeks    Status On-going      PT LONG TERM GOAL #4   Title Patient will be able to ambulate at least 450 feet in 2MWT with no increase in symptoms in order to demonstrate improved gait speed for community ambulation.    Time 6    Period Weeks    Status On-going                   Plan - 05/13/21 1050     Clinical Impression Statement Patient tolerated session well today with no increased complaint of pain at end of session. Discussed proper sitting and standing postures. Advanced HEP to include standing lumbar extension and proper sitting and standing postures and techniques. Advanced therapeutic core strengthening exercises. Continued with theraband exercises to include Pallof press for core and posterior strengthening. Patient continued with a 4/10 pain level at end of session. Prone on elbows and prone press ups did not appear to change his central pain level this session. Patient will continue to benefit from skilled therapy services to reduce deficits and improve functional ability.    Personal Factors and Comorbidities Comorbidity 3+;Fitness;Past/Current Experience;Time since onset of injury/illness/exacerbation    Comorbidities chronic LBP, HTN, hx CVA    Examination-Activity Limitations Locomotion Level;Transfers;Stairs;Stand;Squat    Examination-Participation Restrictions Meal Prep;Cleaning;Shop;Volunteer;Valla Leaver [facility]    Stability/Clinical Decision Making Stable/Uncomplicated    Rehab Potential Fair    PT Frequency 2x / week    PT Duration 6 weeks    PT Treatment/Interventions ADLs/Self Care Home Management;Aquatic Therapy;Electrical Stimulation;Iontophoresis '4mg'$ /ml Dexamethasone;Moist Heat;Traction;Ultrasound;Parrafin;DME Instruction;Cryotherapy;Gait training;Stair training;Functional mobility  training;Therapeutic exercise;Therapeutic activities;Balance training;Patient/family education;Neuromuscular re-education;Manual techniques;Compression bandaging;Passive range of motion;Dry needling;Energy conservation;Splinting;Taping;Spinal Manipulations;Joint Manipulations    PT Next Visit Plan core and hip strengthening, lumbar mobility; sit to stand    PT Home Exercise Plan 8/3 bridge; 04/30/21: ab sets, heel squeeze, prone press ups; 8/12 hip  extension 8/16 palloff press, band rows/ extension    Consulted and Agree with Plan of Care Patient             Patient will benefit from skilled therapeutic intervention in order to improve the following deficits and impairments:  Difficulty walking, Decreased range of motion, Decreased endurance, Increased muscle spasms, Decreased activity tolerance, Pain, Impaired flexibility, Improper body mechanics, Postural dysfunction, Decreased strength, Decreased mobility  Visit Diagnosis: Low back pain, unspecified back pain laterality, unspecified chronicity, unspecified whether sciatica present  Muscle weakness (generalized)  Other abnormalities of gait and mobility  Other symptoms and signs involving the musculoskeletal system     Problem List Patient Active Problem List   Diagnosis Date Noted   Essential hypertension 03/17/2020   Hyperlipidemia 03/17/2020   Cigarette smoker 03/17/2020   Obesity 03/17/2020   Snores 03/17/2020   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 Hartnett-Rands, MS, PT [facility] [place] [phone] Jeannie Done 05/13/2021, 11:40 AM  [place], [name], [address] Phone: [phone]   Fax:  [phone]  Name: CHANTRY TORCHIO MRN: [id number] Date of Birth: [birth year]

## 2021-05-13 NOTE — Patient Instructions (Addendum)
Posture - Sitting    Sit upright, head facing forward. Try using a roll to support lower back. Keep shoulders relaxed, and avoid rounded back. Keep hips level with knees. Avoid crossing legs for long periods.   Copyright  VHI. All rights reserved.   Posture - Standing    Good posture is important. Avoid slouching and forward head thrust. Maintain curve in low back and align ears over shoul- ders, hips over ankles.   Copyright  VHI. All rights reserved.    Standing Arch (Extension)    Place hands in small of back. Using hands as fulcrum, arch backward. Try to keep knees straight. Great exercise if sitting makes pain worse. Use to break up long periods of sitting. Repeat _10___ times after prolonged sitting.   http://gt2.exer.us/247   Copyright  VHI. All rights reserved.   Standing    For prolonged standing, alternate placing one foot in front of the other or on a stool. Wear low-heeled shoes, and maintain good posture.   Copyright  VHI. All rights reserved.

## 2021-05-18 ENCOUNTER — Other Ambulatory Visit: Payer: Self-pay

## 2021-05-18 ENCOUNTER — Ambulatory Visit (HOSPITAL_COMMUNITY): Payer: 59

## 2021-05-18 ENCOUNTER — Encounter (HOSPITAL_COMMUNITY): Payer: Self-pay

## 2021-05-18 DIAGNOSIS — R2689 Other abnormalities of gait and mobility: Secondary | ICD-10-CM

## 2021-05-18 DIAGNOSIS — M6281 Muscle weakness (generalized): Secondary | ICD-10-CM

## 2021-05-18 DIAGNOSIS — R29898 Other symptoms and signs involving the musculoskeletal system: Secondary | ICD-10-CM

## 2021-05-18 DIAGNOSIS — M545 Low back pain, unspecified: Secondary | ICD-10-CM | POA: Diagnosis not present

## 2021-05-18 NOTE — Therapy (Signed)
Carl Garza, [name], [address] Phone: [phone]   Fax:  [phone]  Physical Therapy Treatment  Patient Details  Name: Carl Garza MRN: [id number] Date of Birth: 04-12-1956 Referring Provider (PT): Pieter Partridge Dawley DO   Encounter Date: 05/18/2021   PT End of Session - 05/18/21 1050     Visit Number 6    Number of Visits 12    Date for PT Re-Evaluation 06/09/21    Authorization Type [employer] (no auth, vl 20)    Authorization - Visit Number 6    Authorization - Number of Visits 20    Progress Note Due on Visit 10    PT Start Time [id number]    PT Stop Time 1125    PT Time Calculation (min) 38 min    Activity Tolerance Patient tolerated treatment well    Behavior During Therapy [facility] for tasks assessed/performed             Past Medical History:  Diagnosis Date   Chronic back pain    DDD (degenerative disc disease), cervical    Hypertension    Sciatica     Past Surgical History:  Procedure Laterality Date   COLONOSCOPY N/A 07/11/2018   Procedure: COLONOSCOPY;  Surgeon: Daneil Dolin, MD;  [facility];  Service: Endoscopy;  Laterality: N/A;  9:30   LAPAROSCOPIC APPENDECTOMY N/A 11/04/2017   Procedure: APPENDECTOMY LAPAROSCOPIC;  Surgeon: Aviva Signs, MD;  Location: [facility];  Service: General;  Laterality: N/A;   POLYPECTOMY  07/11/2018   Procedure: POLYPECTOMY;  Surgeon: Daneil Dolin, MD;  Location[facility];  Service: Endoscopy;;  sigmoid    There were no vitals filed for this visit.   Subjective Assessment - 05/18/21 1049     Subjective Pt stated he is feeling pretty good today, feels therapy has been helpful.    Patient Stated Goals decrease pain    Currently in Pain? No/denies                [facility] PT Assessment - 05/18/21 0001       Assessment   Medical Diagnosis Chronic LBP    Referring Provider (PT) Pieter Partridge Dawley DO    Onset Date/Surgical Date 04/28/18    Next MD  Visit September    Prior Therapy none      Posture/Postural Control   Posture/Postural Control Postural limitations    Postural Limitations Rounded Shoulders;Forward head;Increased thoracic kyphosis                           [facility] Adult PT Treatment/Exercise - 05/18/21 0001       Exercises   Exercises Lumbar      Lumbar Exercises: Stretches   Standing Extension 5 reps;10 seconds      Lumbar Exercises: Standing   Functional Squats 15 reps    Functional Squats Limitations front of chair for mechanic; no HHA    Row 20 reps;Theraband    Theraband Level (Row) Level 4 (Blue)    Shoulder Extension 20 reps;Theraband    Theraband Level (Shoulder Extension) Level 4 (Blue)    Other Standing Lumbar Exercises marching with scapular retraction BTB 10x 5" holds    Other Standing Lumbar Exercises NBOS palloff press BTB x20 each      Lumbar Exercises: Seated   Sit to Stand 10 reps    Sit to Stand Limitations 3 pause descending  Lumbar Exercises: Quadruped   Single Arm Raise 10 reps;3 seconds    Straight Leg Raise 10 reps;3 seconds                      PT Short Term Goals - 05/13/21 1050       PT SHORT TERM GOAL #1   Title Patient will be independent with HEP in order to improve functional outcomes.    Time 3    Period Weeks    Status Achieved    Target Date 05/19/21      PT SHORT TERM GOAL #2   Title Patient will report at least 25% improvement in symptoms for improved quality of life.    Time 3    Period Weeks    Status On-going    Target Date 05/19/21               PT Long Term Goals - 05/13/21 1050       PT LONG TERM GOAL #1   Title Patient will report at least 75% improvement in symptoms for improved quality of life.    Time 6    Period Weeks    Status On-going      PT LONG TERM GOAL #2   Title Patient will improve FOTO score by at least 12 points in order to indicate improved tolerance to activity.    Time 6    Period Weeks     Status On-going      PT LONG TERM GOAL #3   Title Patient will be able to complete 5x STS in under 11.4 seconds in order to reduce the risk of falls.    Time 6    Period Weeks    Status On-going      PT LONG TERM GOAL #4   Title Patient will be able to ambulate at least 450 feet in 2MWT with no increase in symptoms in order to demonstrate improved gait speed for community ambulation.    Time 6    Period Weeks    Status On-going                   Plan - 05/18/21 1105     Clinical Impression Statement Added squats, eccentric control with STS, and progressed prone exercises to quadruped for core and proximal strengthening.  Cueing for core activation and breathing through all exercises, some difficulty with coordination during mat activities.  Pt tolerated well to session with no reports of pain, was limited by fatigue with activities.    Personal Factors and Comorbidities Comorbidity 3+;Fitness;Past/Current Experience;Time since onset of injury/illness/exacerbation    Comorbidities chronic LBP, HTN, hx CVA    Examination-Activity Limitations Locomotion Level;Transfers;Stairs;Stand;Squat    Examination-Participation Restrictions Meal Prep;Cleaning;Shop;Volunteer;Carl Garza    Stability/Clinical Decision Making Stable/Uncomplicated    Clinical Decision Making Low    Rehab Potential Fair    PT Frequency 2x / week    PT Duration 6 weeks    PT Treatment/Interventions ADLs/Self Care Home Management;Aquatic Therapy;Electrical Stimulation;Iontophoresis '4mg'$ /ml Dexamethasone;Moist Heat;Traction;Ultrasound;Parrafin;DME Instruction;Cryotherapy;Gait training;Stair training;Functional mobility training;Therapeutic exercise;Therapeutic activities;Balance training;Patient/family education;Neuromuscular re-education;Manual techniques;Compression bandaging;Passive range of motion;Dry needling;Energy conservation;Splinting;Taping;Spinal Manipulations;Joint Manipulations    PT Next Visit  Plan core and hip strengthening, lumbar mobility; sit to stand    PT Home Exercise Plan 8/3 bridge; 04/30/21: ab sets, heel squeeze, prone press ups; 8/12 hip extension 8/16 palloff press, band rows/ extension    Consulted and Agree with Plan of Care Patient  Patient will benefit from skilled therapeutic intervention in order to improve the following deficits and impairments:  Difficulty walking, Decreased range of motion, Decreased endurance, Increased muscle spasms, Decreased activity tolerance, Pain, Impaired flexibility, Improper body mechanics, Postural dysfunction, Decreased strength, Decreased mobility  Visit Diagnosis: Low back pain, unspecified back pain laterality, unspecified chronicity, unspecified whether sciatica present  Muscle weakness (generalized)  Other abnormalities of gait and mobility  Other symptoms and signs involving the musculoskeletal system     Problem List Patient Active Problem List   Diagnosis Date Noted   Essential hypertension 03/17/2020   Hyperlipidemia 03/17/2020   Cigarette smoker 03/17/2020   Obesity 03/17/2020   Snores 03/17/2020   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

## 2021-05-20 ENCOUNTER — Other Ambulatory Visit: Payer: Self-pay

## 2021-05-20 ENCOUNTER — Ambulatory Visit (HOSPITAL_COMMUNITY): Payer: 59 | Admitting: Physical Therapy

## 2021-05-20 ENCOUNTER — Encounter (HOSPITAL_COMMUNITY): Payer: Self-pay | Admitting: Physical Therapy

## 2021-05-20 DIAGNOSIS — M6281 Muscle weakness (generalized): Secondary | ICD-10-CM

## 2021-05-20 DIAGNOSIS — M545 Low back pain, unspecified: Secondary | ICD-10-CM

## 2021-05-20 DIAGNOSIS — R29898 Other symptoms and signs involving the musculoskeletal system: Secondary | ICD-10-CM

## 2021-05-20 DIAGNOSIS — R2689 Other abnormalities of gait and mobility: Secondary | ICD-10-CM

## 2021-05-20 NOTE — Therapy (Signed)
[facility] [address] [place], [name], [address] Phone: [phone]   Fax:  [phone]  Physical Therapy Treatment/Discharge Summary  Patient Details  Name: Carl Garza MRN: [id number] Date of Birth: 31-May-1956 Referring Provider (PT): Pieter Partridge Dawley DO   Encounter Date: 05/20/2021  PHYSICAL THERAPY DISCHARGE SUMMARY  Visits from Start of Care: 7  Current functional level related to goals / functional outcomes: See below   Remaining deficits: See below   Education / Equipment: See below   Patient agrees to discharge. Patient goals were met. Patient is being discharged due to meeting the stated rehab goals.    PT End of Session - 05/20/21 1053     Visit Number 7    Number of Visits 12    Date for PT Re-Evaluation 06/09/21    Authorization Type [employer] (no auth, vl 20)    Authorization - Visit Number 7    Authorization - Number of Visits 20    Progress Note Due on Visit 10    PT Start Time 1052    PT Stop Time 1112    PT Time Calculation (min) 20 min    Activity Tolerance Patient tolerated treatment well    Behavior During Therapy WFL for tasks assessed/performed             Past Medical History:  Diagnosis Date   Chronic back pain    DDD (degenerative disc disease), cervical    Hypertension    Sciatica     Past Surgical History:  Procedure Laterality Date   COLONOSCOPY N/A 07/11/2018   Procedure: COLONOSCOPY;  Surgeon: Daneil Dolin, MD;  [facility];  Service: Endoscopy;  Laterality: N/A;  9:30   LAPAROSCOPIC APPENDECTOMY N/A 11/04/2017   Procedure: APPENDECTOMY LAPAROSCOPIC;  Surgeon: Aviva Signs, MD;  Location: [facility];  Service: General;  Laterality: N/A;   POLYPECTOMY  07/11/2018   Procedure: POLYPECTOMY;  Surgeon: Daneil Dolin, MD;  [facility];  Service: Endoscopy;;  sigmoid    There were no vitals filed for this visit.   Subjective Assessment - 05/20/21 1053      Subjective Patient states back is doing a little better. The only pain he is having is when he bends forward for a long. His HEP has been going well. Patient states 89% improvment with PT intervention. Only issue now is with bending forward too long.    How long can you walk comfortably? unrestricted    Patient Stated Goals decrease pain    Currently in Pain? No/denies                [facility] PT Assessment - 05/20/21 0001       Assessment   Medical Diagnosis Chronic LBP    Referring Provider (PT) Pieter Partridge Dawley DO    Onset Date/Surgical Date 04/28/18    Next MD Visit September    Prior Therapy none      Precautions   Precautions None      Restrictions   Weight Bearing Restrictions No      Balance Screen   Has the patient fallen in the past 6 months No    Has the patient had a decrease in activity level because of a fear of falling?  No    Is the patient reluctant to leave their home because of a fear of falling?  No      Prior Function   Level of Independence Independent  Cognition   Overall Cognitive Status Within Functional Limits for tasks assessed      Observation/Other Assessments   Observations Ambulates without AD    Focus on Therapeutic Outcomes (FOTO)  94% function      AROM   Lumbar Flexion 0% limited    Lumbar Extension 25% limited    Lumbar - Right Side Bend 0% limited    Lumbar - Left Side Bend 0% limited    Lumbar - Right Rotation 0% limited    Lumbar - Left Rotation 0% limited      Transfers   Five time sit to stand comments  11.8 seconds without UE support      Ambulation/Gait   Ambulation/Gait Yes    Ambulation Distance (Feet) 475 Feet    Assistive device None    Gait Comments 2MWT                                   PT Education - 05/20/21 1053     Education Details HEP, reassessment findings    Person(s) Educated Patient    Methods Explanation;Demonstration    Comprehension Verbalized understanding;Returned  demonstration              PT Short Term Goals - 05/20/21 1058       PT SHORT TERM GOAL #1   Title Patient will be independent with HEP in order to improve functional outcomes.    Time 3    Period Weeks    Status Achieved    Target Date 05/19/21      PT SHORT TERM GOAL #2   Title Patient will report at least 25% improvement in symptoms for improved quality of life.    Time 3    Period Weeks    Status Achieved    Target Date 05/19/21               PT Long Term Goals - 05/20/21 1058       PT LONG TERM GOAL #1   Title Patient will report at least 75% improvement in symptoms for improved quality of life.    Time 6    Period Weeks    Status Achieved      PT LONG TERM GOAL #2   Title Patient will improve FOTO score by at least 12 points in order to indicate improved tolerance to activity.    Time 6    Period Weeks    Status Achieved      PT LONG TERM GOAL #3   Title Patient will be able to complete 5x STS in under 11.4 seconds in order to reduce the risk of falls.    Time 6    Period Weeks    Status Achieved      PT LONG TERM GOAL #4   Title Patient will be able to ambulate at least 450 feet in 2MWT with no increase in symptoms in order to demonstrate improved gait speed for community ambulation.    Time 6    Period Weeks    Status Achieved                   Plan - 05/20/21 1053     Clinical Impression Statement Patient has met all short and long term goals with ability to complete HEP and improvement in symptoms, gait, ROM, activity tolerance and functional mobility. Patient states continued symptoms with bending for extended  periods of time but is not limited with ADL. Reviewed HEP with patient and patient feels able to manage symptoms independently. Patient educated on returning to PT if needed. Patient discharged from physical therapy at this time.    Personal Factors and Comorbidities Comorbidity 3+;Fitness;Past/Current Experience;Time since  onset of injury/illness/exacerbation    Comorbidities chronic LBP, HTN, hx CVA    Examination-Activity Limitations Locomotion Level;Transfers;Stairs;Stand;Squat    Examination-Participation Restrictions Meal Prep;Cleaning;Shop;Volunteer;Dorita Sciara    Stability/Clinical Decision Making Stable/Uncomplicated    Rehab Potential Fair    PT Frequency --    PT Duration --    PT Treatment/Interventions ADLs/Self Care Home Management;Aquatic Therapy;Electrical Stimulation;Iontophoresis 15m/ml Dexamethasone;Moist Heat;Traction;Ultrasound;Parrafin;DME Instruction;Cryotherapy;Gait training;Stair training;Functional mobility training;Therapeutic exercise;Therapeutic activities;Balance training;Patient/family education;Neuromuscular re-education;Manual techniques;Compression bandaging;Passive range of motion;Dry needling;Energy conservation;Splinting;Taping;Spinal Manipulations;Joint Manipulations    PT Next Visit Plan n/a    PT Home Exercise Plan 8/3 bridge; 04/30/21: ab sets, heel squeeze, prone press ups; 8/12 hip extension 8/16 palloff press, band rows/ extension    Consulted and Agree with Plan of Care Patient             Patient will benefit from skilled therapeutic intervention in order to improve the following deficits and impairments:  Difficulty walking, Decreased range of motion, Decreased endurance, Increased muscle spasms, Decreased activity tolerance, Pain, Impaired flexibility, Improper body mechanics, Postural dysfunction, Decreased strength, Decreased mobility  Visit Diagnosis: Low back pain, unspecified back pain laterality, unspecified chronicity, unspecified whether sciatica present  Muscle weakness (generalized)  Other abnormalities of gait and mobility  Other symptoms and signs involving the musculoskeletal system     Problem List Patient Active Problem List   Diagnosis Date Noted   Essential hypertension 03/17/2020   Hyperlipidemia 03/17/2020   Cigarette smoker  03/17/2020   Obesity 03/17/2020   Snores 03/17/2020   St[place] ([place]rebrum) [place] B inf[place] d/t intracranial atherosclerosis 03/15/2020   Acute appendicitis 11/04/2017   Acute appendicitis, uncomplicated    Chest pain 07/21/2017   11:20 AM, 05/20/21 AMearl LatinPT, DPT [place] [facility] [address]Phone: [phone]  Fax:  [phone] Name: Carl Garza: [id number] of Birth: [birth year]

## 2021-05-25 ENCOUNTER — Encounter (HOSPITAL_COMMUNITY): Payer: 59 | Admitting: Physical Therapy

## 2021-05-27 ENCOUNTER — Encounter (HOSPITAL_COMMUNITY): Payer: 59 | Admitting: Physical Therapy

## 2021-06-01 ENCOUNTER — Encounter (HOSPITAL_COMMUNITY): Payer: 59

## 2021-06-03 ENCOUNTER — Encounter (HOSPITAL_COMMUNITY): Payer: 59 | Admitting: Physical Therapy

## 2021-06-08 ENCOUNTER — Encounter (HOSPITAL_COMMUNITY): Payer: 59 | Admitting: Physical Therapy

## 2021-06-10 ENCOUNTER — Encounter (HOSPITAL_COMMUNITY): Payer: 59 | Admitting: Physical Therapy

## 2021-10-21 ENCOUNTER — Other Ambulatory Visit: Payer: Self-pay | Admitting: Orthopedic Surgery

## 2021-11-11 DIAGNOSIS — M109 Gout, unspecified: Secondary | ICD-10-CM | POA: Insufficient documentation

## 2021-11-25 ENCOUNTER — Other Ambulatory Visit (HOSPITAL_COMMUNITY): Payer: Self-pay | Admitting: Family Medicine

## 2021-11-25 ENCOUNTER — Other Ambulatory Visit: Payer: Self-pay | Admitting: Family Medicine

## 2021-11-25 DIAGNOSIS — Z122 Encounter for screening for malignant neoplasm of respiratory organs: Secondary | ICD-10-CM

## 2022-01-03 ENCOUNTER — Ambulatory Visit (HOSPITAL_COMMUNITY)
Admission: RE | Admit: 2022-01-03 | Discharge: 2022-01-03 | Disposition: A | Discharge status: Home / Self Care | Payer: 59 | Source: Ambulatory Visit | Attending: Family Medicine | Admitting: Family Medicine

## 2022-01-03 DIAGNOSIS — Z122 Encounter for screening for malignant neoplasm of respiratory organs: Secondary | ICD-10-CM | POA: Diagnosis present

## 2023-01-26 ENCOUNTER — Other Ambulatory Visit (HOSPITAL_COMMUNITY): Payer: Self-pay | Admitting: Family Medicine

## 2023-01-26 DIAGNOSIS — F17200 Nicotine dependence, unspecified, uncomplicated: Secondary | ICD-10-CM

## 2023-03-10 ENCOUNTER — Ambulatory Visit (HOSPITAL_COMMUNITY)
Admission: RE | Admit: 2023-03-10 | Discharge: 2023-03-10 | Disposition: A | Discharge status: Home / Self Care | Payer: 59 | Source: Ambulatory Visit | Attending: Family Medicine | Admitting: Family Medicine

## 2023-03-10 DIAGNOSIS — F17200 Nicotine dependence, unspecified, uncomplicated: Secondary | ICD-10-CM | POA: Insufficient documentation

## 2023-03-10 DIAGNOSIS — F17210 Nicotine dependence, cigarettes, uncomplicated: Secondary | ICD-10-CM | POA: Diagnosis not present

## 2023-06-02 ENCOUNTER — Other Ambulatory Visit (HOSPITAL_COMMUNITY): Payer: Self-pay | Admitting: Family Medicine

## 2023-06-02 ENCOUNTER — Encounter (HOSPITAL_COMMUNITY): Payer: Self-pay | Admitting: Family Medicine

## 2023-06-02 DIAGNOSIS — F17200 Nicotine dependence, unspecified, uncomplicated: Secondary | ICD-10-CM

## 2023-06-14 ENCOUNTER — Encounter: Payer: Self-pay | Admitting: *Deleted

## 2023-10-17 ENCOUNTER — Telehealth: Payer: Self-pay | Admitting: *Deleted

## 2023-10-17 NOTE — Telephone Encounter (Signed)
  Procedure: COLONSOCOPY  Height: 5'10 Weight: 202LBS        Have you had a colonoscopy before?  07/11/18, DR. Jena Gauss  Do you have family history of colon cancer?  NO  Do you have a family history of polyps? NO  Previous colonoscopy with polyps removed? YES  Do you have a history colorectal cancer?   NO  Are you diabetic?  NO  Do you have a prosthetic or mechanical heart valve? NO  Do you have a pacemaker/defibrillator?   NO  Have you had endocarditis/atrial fibrillation?  NO  Do you use supplemental oxygen/CPAP?  NO  Have you had joint replacement within the last 12 months?  NO  Do you tend to be constipated or have to use laxatives?  NO   Do you have history of alcohol use? If yes, how much and how often.  NO  Do you have history or are you using drugs? If yes, what do are you  using?  NO  Have you ever had a stroke/heart attack?  02/2018  Have you ever had a heart or other vascular stent placed,?  Do you take weight loss medication? NO  Do you take any blood-thinning medications such as: (Plavix, aspirin, Coumadin, Aggrenox, Brilinta, Xarelto, Eliquis, Pradaxa, Savaysa or Effient)? ASPIRIN 81MG   If yes we need the name, milligram, dosage and who is prescribing doctor:               Current Outpatient Medications  Medication Sig Dispense Refill   allopurinol (ZYLOPRIM) 100 MG tablet Take 100 mg by mouth daily.     colchicine 0.6 MG tablet Take 0.6 mg by mouth daily.     rosuvastatin (CRESTOR) 20 MG tablet Take 20 mg by mouth daily.     sildenafil (VIAGRA) 50 MG tablet Take 50 mg by mouth as needed for erectile dysfunction.     amLODipine (NORVASC) 10 MG tablet Take 10 mg by mouth every evening.   2   aspirin EC 81 MG EC tablet Take 1 tablet (81 mg total) by mouth daily. Swallow whole. 30 tablet 11   gabapentin (NEURONTIN) 300 MG capsule TAKE 1 CAPSULE(300 MG) BY MOUTH THREE TIMES DAILY (Patient taking differently: As needed) 90 capsule 5    valsartan-hydrochlorothiazide (DIOVAN-HCT) 160-25 MG tablet Take 1 tablet by mouth every morning.      No current facility-administered medications for this visit.    No Known Allergies

## 2023-11-01 NOTE — Telephone Encounter (Signed)
 Ok to schedule. ASA 3. Room 3. Trilyte, tap water enemas due to CKD

## 2023-11-02 NOTE — Telephone Encounter (Signed)
 LMOVM to return

## 2023-11-09 ENCOUNTER — Encounter: Payer: Self-pay | Admitting: *Deleted

## 2023-11-09 NOTE — Telephone Encounter (Signed)
LMOVM to return call. Mailed letter

## 2023-11-20 ENCOUNTER — Other Ambulatory Visit: Payer: Self-pay | Admitting: *Deleted

## 2023-11-20 ENCOUNTER — Encounter: Payer: Self-pay | Admitting: *Deleted

## 2023-11-20 ENCOUNTER — Telehealth: Payer: Self-pay | Admitting: Internal Medicine

## 2023-11-20 MED ORDER — PEG 3350-KCL-NA BICARB-NACL 420 G PO SOLR
4000.0000 mL | Freq: Once | ORAL | 0 refills | Status: AC
Start: 2023-11-20 — End: 2023-11-20

## 2023-11-20 NOTE — Telephone Encounter (Signed)
 Wife left a message that they received a letter to call and schedule procedure.  Having difficult time reaching the patient.  The phone number she left is [phone]

## 2023-11-20 NOTE — Telephone Encounter (Signed)
 Pt has been scheduled for 01/17/24 with Dr.Rourk, instructions mailed and prep sent to the pharmacy

## 2023-11-20 NOTE — Telephone Encounter (Signed)
 Pt called and has been scheduled for 01/17/24. See previous TE

## 2023-11-20 NOTE — Telephone Encounter (Signed)
 Questionnaire from recall, no referral needed

## 2023-12-01 DIAGNOSIS — I10 Essential (primary) hypertension: Secondary | ICD-10-CM | POA: Diagnosis not present

## 2023-12-01 DIAGNOSIS — M109 Gout, unspecified: Secondary | ICD-10-CM | POA: Diagnosis not present

## 2023-12-01 DIAGNOSIS — R7303 Prediabetes: Secondary | ICD-10-CM | POA: Diagnosis not present

## 2023-12-06 DIAGNOSIS — J439 Emphysema, unspecified: Secondary | ICD-10-CM | POA: Diagnosis not present

## 2023-12-06 DIAGNOSIS — I700 Atherosclerosis of aorta: Secondary | ICD-10-CM | POA: Diagnosis not present

## 2023-12-06 DIAGNOSIS — R7303 Prediabetes: Secondary | ICD-10-CM | POA: Diagnosis not present

## 2023-12-06 DIAGNOSIS — I10 Essential (primary) hypertension: Secondary | ICD-10-CM | POA: Diagnosis not present

## 2023-12-06 DIAGNOSIS — N1831 Chronic kidney disease, stage 3a: Secondary | ICD-10-CM | POA: Diagnosis not present

## 2023-12-06 DIAGNOSIS — E782 Mixed hyperlipidemia: Secondary | ICD-10-CM | POA: Diagnosis not present

## 2023-12-06 DIAGNOSIS — I129 Hypertensive chronic kidney disease with stage 1 through stage 4 chronic kidney disease, or unspecified chronic kidney disease: Secondary | ICD-10-CM | POA: Diagnosis not present

## 2023-12-06 DIAGNOSIS — I7121 Aneurysm of the ascending aorta, without rupture: Secondary | ICD-10-CM | POA: Diagnosis not present

## 2023-12-06 DIAGNOSIS — Z8673 Personal history of transient ischemic attack (TIA), and cerebral infarction without residual deficits: Secondary | ICD-10-CM | POA: Diagnosis not present

## 2023-12-06 DIAGNOSIS — M545 Low back pain, unspecified: Secondary | ICD-10-CM | POA: Diagnosis not present

## 2023-12-06 DIAGNOSIS — F17200 Nicotine dependence, unspecified, uncomplicated: Secondary | ICD-10-CM | POA: Diagnosis not present

## 2023-12-06 DIAGNOSIS — M109 Gout, unspecified: Secondary | ICD-10-CM | POA: Diagnosis not present

## 2024-01-08 NOTE — Patient Instructions (Signed)
   Your procedure is scheduled on: 01/17/2024  Report to [facility] Main Entrance at  6:45   AM.  Call this number if you have problems the morning of surgery: [phone]   Remember:              Follow Directions on the letter you received from Your Physician's office regarding the Bowel Prep              No Smoking the day of Procedure :   Take these medicines the morning of surgery with A SIP OF WATER: Amlodipine and gabapentin if needed   Do not wear jewelry, make-up or nail polish.    Do not bring valuables to the hospital.  Contacts, dentures or bridgework may not be worn into surgery.  .   Patients discharged the day of surgery will not be allowed to drive home.     Colonoscopy, Adult, Care After This sheet gives you information about how to care for yourself after your procedure. Your health care provider may also give you more specific instructions. If you have problems or questions, contact your health care provider. What can I expect after the procedure? After the procedure, it is common to have: A small amount of blood in your stool for 24 hours after the procedure. Some gas. Mild abdominal cramping or bloating.  Follow these instructions at home: General instructions  For the first 24 hours after the procedure: Do not drive or use machinery. Do not sign important documents. Do not drink alcohol. Do your regular daily activities at a slower pace than normal. Eat soft, easy-to-digest foods. Rest often. Take over-the-counter or prescription medicines only as told by your health care provider. It is up to you to get the results of your procedure. Ask your health care provider, or the department performing the procedure, when your results will be ready. Relieving cramping and bloating Try walking around when you have cramps or feel bloated. Apply heat to your abdomen as told by your health care provider. Use a heat source that your health care provider recommends,  such as a moist heat pack or a heating pad. Place a towel between your skin and the heat source. Leave the heat on for 20-30 minutes. Remove the heat if your skin turns bright red. This is especially important if you are unable to feel pain, heat, or cold. You may have a greater risk of getting burned. Eating and drinking Drink enough fluid to keep your urine clear or pale yellow. Resume your normal diet as instructed by your health care provider. Avoid heavy or fried foods that are hard to digest. Avoid drinking alcohol for as long as instructed by your health care provider. Contact a health care provider if: You have blood in your stool 2-3 days after the procedure. Get help right away if: You have more than a small spotting of blood in your stool. You pass large blood clots in your stool. Your abdomen is swollen. You have nausea or vomiting. You have a fever. You have increasing abdominal pain that is not relieved with medicine. This information is not intended to replace advice given to you by your health care provider. Make sure you discuss any questions you have with your health care provider. Document Released: 04/26/2004 Document Revised: 06/06/2016 Document Reviewed: 11/24/2015 Elsevier Interactive Patient Education  [employer].

## 2024-01-10 ENCOUNTER — Encounter (HOSPITAL_COMMUNITY)
Admission: RE | Admit: 2024-01-10 | Discharge: 2024-01-10 | Disposition: A | Discharge status: Home / Self Care | Payer: Medicare Other | Source: Ambulatory Visit | Attending: Internal Medicine | Admitting: Internal Medicine

## 2024-01-10 ENCOUNTER — Encounter (HOSPITAL_COMMUNITY): Payer: Self-pay

## 2024-01-10 VITALS — BP 122/88 | HR 78 | Temp 97.9°F | Resp 18 | Ht 68.0 in | Wt 205.2 lb

## 2024-01-10 DIAGNOSIS — Z79899 Other long term (current) drug therapy: Secondary | ICD-10-CM | POA: Insufficient documentation

## 2024-01-10 DIAGNOSIS — Z01810 Encounter for preprocedural cardiovascular examination: Secondary | ICD-10-CM | POA: Diagnosis present

## 2024-01-10 DIAGNOSIS — I10 Essential (primary) hypertension: Secondary | ICD-10-CM | POA: Insufficient documentation

## 2024-01-10 DIAGNOSIS — Z01812 Encounter for preprocedural laboratory examination: Secondary | ICD-10-CM | POA: Diagnosis present

## 2024-01-10 DIAGNOSIS — I4510 Unspecified right bundle-branch block: Secondary | ICD-10-CM | POA: Diagnosis not present

## 2024-01-10 DIAGNOSIS — Z01818 Encounter for other preprocedural examination: Secondary | ICD-10-CM | POA: Diagnosis not present

## 2024-01-10 HISTORY — DX: Unspecified osteoarthritis, unspecified site: M19.90

## 2024-01-10 LAB — BASIC METABOLIC PANEL WITH GFR
Anion gap: 15 (ref 5–15)
BUN: 23 mg/dL (ref 8–23)
CO2: 24 mmol/L (ref 22–32)
Calcium: 9.7 mg/dL (ref 8.9–10.3)
Chloride: 101 mmol/L (ref 98–111)
Creatinine, Ser: 1.45 mg/dL — ABNORMAL HIGH (ref 0.61–1.24)
GFR, Estimated: 53 mL/min — ABNORMAL LOW (ref >60)
Glucose, Bld: 113 mg/dL — ABNORMAL HIGH (ref 70–99)
Potassium: 3.5 mmol/L (ref 3.5–5.1)
Sodium: 140 mmol/L (ref 135–145)

## 2024-01-17 ENCOUNTER — Ambulatory Visit (HOSPITAL_COMMUNITY): Admitting: Anesthesiology

## 2024-01-17 ENCOUNTER — Encounter (HOSPITAL_COMMUNITY)
Admission: RE | Disposition: A | Discharge status: Home / Self Care | Payer: Self-pay | Source: Home / Self Care | Attending: Internal Medicine

## 2024-01-17 ENCOUNTER — Encounter (HOSPITAL_COMMUNITY): Payer: Self-pay | Admitting: Internal Medicine

## 2024-01-17 ENCOUNTER — Ambulatory Visit (HOSPITAL_COMMUNITY)
Admission: RE | Admit: 2024-01-17 | Discharge: 2024-01-17 | Disposition: A | Discharge status: Home / Self Care | Payer: Medicare Other | Attending: Internal Medicine | Admitting: Internal Medicine

## 2024-01-17 ENCOUNTER — Other Ambulatory Visit: Payer: Self-pay

## 2024-01-17 DIAGNOSIS — Z860100 Personal history of colon polyps, unspecified: Secondary | ICD-10-CM

## 2024-01-17 DIAGNOSIS — K5730 Diverticulosis of large intestine without perforation or abscess without bleeding: Secondary | ICD-10-CM

## 2024-01-17 DIAGNOSIS — Z1211 Encounter for screening for malignant neoplasm of colon: Secondary | ICD-10-CM | POA: Diagnosis not present

## 2024-01-17 DIAGNOSIS — Z800 Family history of malignant neoplasm of digestive organs: Secondary | ICD-10-CM | POA: Insufficient documentation

## 2024-01-17 DIAGNOSIS — F17210 Nicotine dependence, cigarettes, uncomplicated: Secondary | ICD-10-CM | POA: Insufficient documentation

## 2024-01-17 DIAGNOSIS — D120 Benign neoplasm of cecum: Secondary | ICD-10-CM | POA: Insufficient documentation

## 2024-01-17 DIAGNOSIS — I10 Essential (primary) hypertension: Secondary | ICD-10-CM | POA: Insufficient documentation

## 2024-01-17 DIAGNOSIS — K635 Polyp of colon: Secondary | ICD-10-CM

## 2024-01-17 HISTORY — PX: COLONOSCOPY WITH PROPOFOL: SHX5780

## 2024-01-17 SURGERY — COLONOSCOPY WITH PROPOFOL
Anesthesia: General

## 2024-01-17 MED ORDER — PROPOFOL 500 MG/50ML IV EMUL
INTRAVENOUS | Status: DC | PRN
  Administered 2024-01-17: 200 ug/kg/min via INTRAVENOUS

## 2024-01-17 MED ORDER — LACTATED RINGERS IV SOLN: INTRAVENOUS | Status: DC

## 2024-01-17 MED ORDER — PROPOFOL 10 MG/ML IV BOLUS
INTRAVENOUS | Status: DC | PRN
  Administered 2024-01-17: 100 mg via INTRAVENOUS

## 2024-01-17 NOTE — H&P (Signed)
 @LOGO @   Primary Care Physician:  Wendi Ham, NP Primary Gastroenterologist:  Dr.   Pre-Procedure History & Physical: HPI:  Carl Garza is a 68 y.o. male here for surveillance colonoscopy history of colonic polyp positive family history colon cancer.  Past Medical History:  Diagnosis Date   Arthritis    Chronic back pain    DDD (degenerative disc disease), cervical    Hypertension    Sciatica    Stroke [facility]) 2022    Past Surgical History:  Procedure Laterality Date   COLONOSCOPY N/A 07/11/2018   Procedure: COLONOSCOPY;  Surgeon: Suzette Espy, MD;  Location: [facility];  Service: Endoscopy;  Laterality: N/A;  9:30   LAPAROSCOPIC APPENDECTOMY N/A 11/04/2017   Procedure: APPENDECTOMY LAPAROSCOPIC;  Surgeon: Alanda Allegra, MD;  Location: [facility] ORS;  Service: General;  Laterality: N/A;   POLYPECTOMY  07/11/2018   Procedure: POLYPECTOMY;  Surgeon: Suzette Espy, MD;  Location: [facility];  Service: Endoscopy;;  sigmoid    Prior to Admission medications   Medication Sig Start Date End Date Taking? Authorizing Provider  allopurinol (ZYLOPRIM) 100 MG tablet Take 100 mg by mouth daily.   Yes [provider]  amLODipine  (NORVASC ) 10 MG tablet Take 10 mg by mouth every evening.  09/23/17  Yes [provider]  aspirin  EC 81 MG EC tablet Take 1 tablet (81 mg total) by mouth daily. Swallow whole. 03/18/20  Yes Rodman Clam, NP  colchicine 0.6 MG tablet Take 0.6 mg by mouth daily.   Yes [provider]  gabapentin  (NEURONTIN ) 300 MG capsule TAKE 1 CAPSULE(300 MG) BY MOUTH THREE TIMES DAILY Patient taking differently: As needed 10/21/21  Yes Darrin Emerald, MD  rosuvastatin (CRESTOR) 20 MG tablet Take 20 mg by mouth daily.   Yes [provider]  sildenafil (VIAGRA) 50 MG tablet Take 50 mg by mouth as needed for erectile dysfunction.   Yes [provider]  valsartan -hydrochlorothiazide  (DIOVAN -HCT) 160-25 MG tablet Take 1 tablet  by mouth every morning.    Yes [provider]    Allergies as of 11/20/2023   (No Known Allergies)    Family History  Problem Relation Age of Onset   Asthma Father     Social History   Socioeconomic History   Marital status: Married    Spouse name: Not on file   Number of children: Not on file   Years of education: Not on file   Highest education level: Not on file  Occupational History   Not on file  Tobacco Use   Smoking status: Every Day    Current packs/day: 0.50    Types: Cigarettes   Smokeless tobacco: Never  Vaping Use   Vaping status: Never Used  Substance and Sexual Activity   Alcohol use: Yes    Comment: on football days   Drug use: No   Sexual activity: Not on file  Other Topics Concern   Not on file  Social History Narrative   Not on file   Social Drivers of Health   Financial Resource Strain: Not on file  Food Insecurity: Not on file  Transportation Needs: Not on file  Physical Activity: Not on file  Stress: Not on file  Social Connections: Not on file  Intimate Partner Violence: Not on file    Review of Systems: See HPI, otherwise negative ROS  Physical Exam: BP 114/87   Pulse 95   Temp 98.7 F (37.1 C) (Oral)   Resp  19   Ht 5\' 8"  (1.727 m)   Wt 91.2 kg   SpO2 96%   BMI 30.56 kg/m  Lungs:  Clear throughout to auscultation.   No wheezes, crackles, or rhonchi. No acute distress. Heart:  Regular rate and rhythm; no murmurs, clicks, rubs,  or gallops. Abdomen: Non-distended, normal bowel sounds.  Soft and nontender without appreciable mass or hepatosplenomegaly.  Pulses:  Normal pulses noted. Extremities:  Without clubbing or edema.  Impression/Plan:68 year old gentleman history of colon cancer.  History of colonic polyp here for surveillance colonoscopy.  The risks, benefits, limitations, alternatives and imponderables have been reviewed with the patient. Questions have been answered. All parties are agreeable.   The risks,  benefits, limitations, alternatives and imponderables have been reviewed with the patient. Questions have been answered. All parties are agreeable.       Notice: This dictation was prepared with Dragon dictation along with smaller phrase technology. Any transcriptional errors that result from this process are unintentional and may not be corrected upon review.

## 2024-01-17 NOTE — Op Note (Signed)
 [facility] Patient Name: Carl Garza Procedure Date: 01/17/2024 8:09 AM MRN: [id number] Date of Birth: 03/17/1956 Attending MD: Gemma Kelp , MD, [id number] CSN: [id number] Age: 68 Admit Type: Outpatient Procedure:                Colonoscopy Indications:              Screening in patient at increased risk: Family                            history of 1st-degree relative with colorectal                            cancer before age 33 years Providers:                Gemma Kelp, MD, Pasco Bond, RN, [place]                            Wilson, Technician, Theola Fitch Referring MD:              Medicines:                Propofol  per Anesthesia Complications:            No immediate complications. Estimated Blood Loss:     Estimated blood loss was minimal. Procedure:                Pre-Anesthesia Assessment:                           - Prior to the procedure, a History and Physical                            was performed, and patient medications and                            allergies were reviewed. The patient's tolerance of                            previous anesthesia was also reviewed. The risks                            and benefits of the procedure and the sedation                            options and risks were discussed with the patient.                            All questions were answered, and informed consent                            was obtained. Prior Anticoagulants: The patient has                            taken no anticoagulant or antiplatelet agents. ASA  Grade Assessment: III - A patient with severe                            systemic disease. After reviewing the risks and                            benefits, the patient was deemed in satisfactory                            condition to undergo the procedure.                           After obtaining informed consent, the colonoscope                             was passed under direct vision. Throughout the                            procedure, the patient's blood pressure, pulse, and                            oxygen  saturations were monitored continuously. The                            [id number]) scope was introduced through the                            anus and advanced to the the cecum, identified by                            appendiceal orifice and ileocecal valve. The                            colonoscopy was performed without difficulty. The                            patient tolerated the procedure well. The quality                            of the bowel preparation was adequate. The                            ileocecal valve, appendiceal orifice, and rectum                            were photographed. The colonoscopy was performed                            without difficulty. The patient tolerated the                            procedure well. Scope In: 8:43:23 AM Scope Out: 8:56:27 AM Scope Withdrawal Time: 0 hours 8 minutes 22 seconds  Total Procedure Duration: 0 hours 13 minutes 4 seconds  Findings:      Scattered medium-mouthed and small-mouthed diverticula were found in the       entire colon.      A 3 mm polyp was found in the cecum. The polyp was semi-pedunculated.       The polyp was removed with a cold snare. Resection and retrieval were       complete. Estimated blood loss was minimal.      The exam was otherwise without abnormality on direct and retroflexion       views. Impression:               - Diverticulosis in the entire examined colon.                           - One 3 mm polyp in the cecum, removed with a cold                            snare. Resected and retrieved.                           - The examination was otherwise normal on direct                            and retroflexion views. Moderate Sedation:      Moderate (conscious) sedation was personally administered by an       anesthesia  professional. The following parameters were monitored: oxygen        saturation, heart rate, blood pressure, respiratory rate, EKG, adequacy       of pulmonary ventilation, and response to care. Recommendation:           - Patient has a contact number available for                            emergencies. The signs and symptoms of potential                            delayed complications were discussed with the                            patient. Return to normal activities tomorrow.                            Written discharge instructions were provided to the                            patient.                           - Advance diet as tolerated.                           - Continue present medications.                           - Repeat colonoscopy date to be determined after  pending pathology results are reviewed for                            surveillance.                           - Return to GI office (date not yet determined). Procedure Code(s):        --- Professional ---                           [phone], Colonoscopy, flexible; with removal of                            tumor(s), polyp(s), or other lesion(s) by snare                            technique Diagnosis Code(s):        --- Professional ---                           Z80.0, Family history of malignant neoplasm of                            digestive organs                           D12.0, Benign neoplasm of cecum                           K57.30, Diverticulosis of large intestine without                            perforation or abscess without bleeding CPT copyright 2022 [employer]. All rights reserved. The codes documented in this report are preliminary and upon coder review may  be revised to meet current compliance requirements. Carl Garza. Carl Vanburen, MD Gemma Kelp, MD 01/17/2024 9:22:27 AM This report has been signed electronically. Number of Addenda: 0

## 2024-01-17 NOTE — Transfer of Care (Signed)
 Immediate Anesthesia Transfer of Care Note  Patient: Carl Garza  Procedure(s) Performed: COLONOSCOPY WITH PROPOFOL   Patient Location: Endoscopy Unit  Anesthesia Type:General  Level of Consciousness: awake, alert , oriented, and patient cooperative  Airway & Oxygen  Therapy: Patient Spontanous Breathing  Post-op Assessment: Report given to RN, Post -op Vital signs reviewed and stable, and Patient moving all extremities X 4  Post vital signs: Reviewed and stable  Last Vitals:  Vitals Value Taken Time  BP 95/68 01/17/24 0902  Temp 36.4 C 01/17/24 0902  Pulse 92 01/17/24 0902  Resp 19 01/17/24 0902  SpO2 93 % 01/17/24 0902    Last Pain:  Vitals:   01/17/24 0902  TempSrc: Oral  PainSc: Asleep         Complications: No notable events documented.

## 2024-01-17 NOTE — Anesthesia Preprocedure Evaluation (Signed)
 Anesthesia Evaluation  Patient identified by MRN, date of birth, ID band Patient awake    Reviewed: Allergy & Precautions, H&P , NPO status , Patient's Chart, lab work & pertinent test results, reviewed documented beta blocker date and time   Airway Mallampati: II  TM Distance: >3 FB Neck ROM: full    Dental no notable dental hx.    Pulmonary neg pulmonary ROS, Current Smoker and Patient abstained from smoking.   Pulmonary exam normal breath sounds clear to auscultation       Cardiovascular Exercise Tolerance: Good hypertension, negative cardio ROS  Rhythm:regular Rate:Normal     Neuro/Psych  Neuromuscular disease CVA negative neurological ROS  negative psych ROS   GI/Hepatic negative GI ROS, Neg liver ROS,,,  Endo/Other  negative endocrine ROS    Renal/GU negative Renal ROS  negative genitourinary   Musculoskeletal   Abdominal   Peds  Hematology negative hematology ROS (+)   Anesthesia Other Findings   Reproductive/Obstetrics negative OB ROS                             Anesthesia Physical Anesthesia Plan  ASA: 2  Anesthesia Plan: General   Post-op Pain Management:    Induction:   PONV Risk Score and Plan: Propofol  infusion  Airway Management Planned:   Additional Equipment:   Intra-op Plan:   Post-operative Plan:   Informed Consent: I have reviewed the patients History and Physical, chart, labs and discussed the procedure including the risks, benefits and alternatives for the proposed anesthesia with the patient or authorized representative who has indicated his/her understanding and acceptance.     Dental Advisory Given  Plan Discussed with: CRNA  Anesthesia Plan Comments:        Anesthesia Quick Evaluation

## 2024-01-17 NOTE — Discharge Instructions (Signed)
  Colonoscopy Discharge Instructions  Read the instructions outlined below and refer to this sheet in the next few weeks. These discharge instructions provide you with general information on caring for yourself after you leave the hospital. Your doctor may also give you specific instructions. While your treatment has been planned according to the most current medical practices available, unavoidable complications occasionally occur. If you have any problems or questions after discharge, call Dr. Riley Cheadle at [phone]. ACTIVITY You may resume your regular activity, but move at a slower pace for the next 24 hours.  Take frequent rest periods for the next 24 hours.  Walking will help get rid of the air and reduce the bloated feeling in your belly (abdomen).  No driving for 24 hours (because of the medicine (anesthesia) used during the test).   Do not sign any important legal documents or operate any machinery for 24 hours (because of the anesthesia used during the test).  NUTRITION Drink plenty of fluids.  You may resume your normal diet as instructed by your doctor.  Begin with a light meal and progress to your normal diet. Heavy or fried foods are harder to digest and may make you feel sick to your stomach (nauseated).  Avoid alcoholic beverages for 24 hours or as instructed.  MEDICATIONS You may resume your normal medications unless your doctor tells you otherwise.  WHAT YOU CAN EXPECT TODAY Some feelings of bloating in the abdomen.  Passage of more gas than usual.  Spotting of blood in your stool or on the toilet paper.  IF YOU HAD POLYPS REMOVED DURING THE COLONOSCOPY: No aspirin  products for 7 days or as instructed.  No alcohol for 7 days or as instructed.  Eat a soft diet for the next 24 hours.  FINDING OUT THE RESULTS OF YOUR TEST Not all test results are available during your visit. If your test results are not back during the visit, make an appointment with your caregiver to find out the  results. Do not assume everything is normal if you have not heard from your caregiver or the medical facility. It is important for you to follow up on all of your test results.  SEEK IMMEDIATE MEDICAL ATTENTION IF: You have more than a spotting of blood in your stool.  Your belly is swollen (abdominal distention).  You are nauseated or vomiting.  You have a temperature over 101.  You have abdominal pain or discomfort that is severe or gets worse throughout the day.     1 polyp found and removed today.  Also you have diverticulosis  Further recommendations to follow pending review of pathology report  At patient request, I called Tonya at [phone] findings and recommendations

## 2024-01-18 ENCOUNTER — Encounter: Payer: Self-pay | Admitting: Internal Medicine

## 2024-01-18 ENCOUNTER — Encounter (HOSPITAL_COMMUNITY): Payer: Self-pay | Admitting: Internal Medicine

## 2024-01-18 LAB — SURGICAL PATHOLOGY

## 2024-01-18 NOTE — Anesthesia Postprocedure Evaluation (Signed)
 Anesthesia Post Note  Patient: Carl Garza  Procedure(s) Performed: COLONOSCOPY WITH PROPOFOL   Patient location during evaluation: Phase II Anesthesia Type: General Level of consciousness: awake Pain management: pain level controlled Vital Signs Assessment: post-procedure vital signs reviewed and stable Respiratory status: spontaneous breathing and respiratory function stable Cardiovascular status: blood pressure returned to baseline and stable Postop Assessment: no headache and no apparent nausea or vomiting Anesthetic complications: no Comments: Late entry   No notable events documented.   Last Vitals:  Vitals:   01/17/24 0911 01/17/24 0917  BP: 100/86 103/78  Pulse: 79 81  Resp: (!) 22 18  Temp:    SpO2: 96% 97%    Last Pain:  Vitals:   01/17/24 0917  TempSrc:   PainSc: 0-No pain                 Coretha Dew

## 2024-01-22 ENCOUNTER — Other Ambulatory Visit (HOSPITAL_COMMUNITY): Payer: Self-pay | Admitting: Family Medicine

## 2024-01-22 DIAGNOSIS — F17200 Nicotine dependence, unspecified, uncomplicated: Secondary | ICD-10-CM

## 2024-02-11 ENCOUNTER — Ambulatory Visit (HOSPITAL_COMMUNITY)
Admission: RE | Admit: 2024-02-11 | Discharge: 2024-02-11 | Disposition: A | Discharge status: Home / Self Care | Source: Ambulatory Visit | Attending: Family Medicine | Admitting: Family Medicine

## 2024-02-11 DIAGNOSIS — F17210 Nicotine dependence, cigarettes, uncomplicated: Secondary | ICD-10-CM | POA: Insufficient documentation

## 2024-02-11 DIAGNOSIS — I2510 Atherosclerotic heart disease of native coronary artery without angina pectoris: Secondary | ICD-10-CM | POA: Diagnosis not present

## 2024-02-11 DIAGNOSIS — I77810 Thoracic aortic ectasia: Secondary | ICD-10-CM | POA: Diagnosis not present

## 2024-02-11 DIAGNOSIS — F17200 Nicotine dependence, unspecified, uncomplicated: Secondary | ICD-10-CM | POA: Diagnosis present

## 2024-02-11 DIAGNOSIS — Z122 Encounter for screening for malignant neoplasm of respiratory organs: Secondary | ICD-10-CM | POA: Insufficient documentation

## 2024-02-11 DIAGNOSIS — I700 Atherosclerosis of aorta: Secondary | ICD-10-CM | POA: Insufficient documentation

## 2024-02-11 DIAGNOSIS — J439 Emphysema, unspecified: Secondary | ICD-10-CM | POA: Diagnosis not present

## 2024-04-19 ENCOUNTER — Other Ambulatory Visit (HOSPITAL_COMMUNITY): Payer: Self-pay

## 2024-04-19 ENCOUNTER — Ambulatory Visit (HOSPITAL_COMMUNITY)
Admission: RE | Admit: 2024-04-19 | Discharge: 2024-04-19 | Disposition: A | Discharge status: Home / Self Care | Source: Ambulatory Visit

## 2024-04-19 DIAGNOSIS — R519 Headache, unspecified: Secondary | ICD-10-CM | POA: Diagnosis not present

## 2024-05-01 DIAGNOSIS — R7303 Prediabetes: Secondary | ICD-10-CM | POA: Diagnosis not present

## 2024-05-01 DIAGNOSIS — I10 Essential (primary) hypertension: Secondary | ICD-10-CM | POA: Diagnosis not present

## 2024-05-01 DIAGNOSIS — M109 Gout, unspecified: Secondary | ICD-10-CM | POA: Diagnosis not present

## 2024-05-07 ENCOUNTER — Other Ambulatory Visit (HOSPITAL_COMMUNITY): Payer: Self-pay | Admitting: Family Medicine

## 2024-05-07 DIAGNOSIS — R7303 Prediabetes: Secondary | ICD-10-CM | POA: Diagnosis not present

## 2024-05-07 DIAGNOSIS — Z8673 Personal history of transient ischemic attack (TIA), and cerebral infarction without residual deficits: Secondary | ICD-10-CM | POA: Diagnosis not present

## 2024-05-07 DIAGNOSIS — M545 Low back pain, unspecified: Secondary | ICD-10-CM | POA: Diagnosis not present

## 2024-05-07 DIAGNOSIS — E782 Mixed hyperlipidemia: Secondary | ICD-10-CM | POA: Diagnosis not present

## 2024-05-07 DIAGNOSIS — M109 Gout, unspecified: Secondary | ICD-10-CM | POA: Diagnosis not present

## 2024-05-07 DIAGNOSIS — I7121 Aneurysm of the ascending aorta, without rupture: Secondary | ICD-10-CM | POA: Diagnosis not present

## 2024-05-07 DIAGNOSIS — I10 Essential (primary) hypertension: Secondary | ICD-10-CM | POA: Diagnosis not present

## 2024-05-07 DIAGNOSIS — I700 Atherosclerosis of aorta: Secondary | ICD-10-CM | POA: Diagnosis not present

## 2024-05-07 DIAGNOSIS — R221 Localized swelling, mass and lump, neck: Secondary | ICD-10-CM

## 2024-05-07 DIAGNOSIS — I129 Hypertensive chronic kidney disease with stage 1 through stage 4 chronic kidney disease, or unspecified chronic kidney disease: Secondary | ICD-10-CM | POA: Diagnosis not present

## 2024-05-07 DIAGNOSIS — L989 Disorder of the skin and subcutaneous tissue, unspecified: Secondary | ICD-10-CM | POA: Diagnosis not present

## 2024-05-07 DIAGNOSIS — Z0001 Encounter for general adult medical examination with abnormal findings: Secondary | ICD-10-CM | POA: Diagnosis not present

## 2024-05-10 ENCOUNTER — Ambulatory Visit (HOSPITAL_COMMUNITY)
Admission: RE | Admit: 2024-05-10 | Discharge: 2024-05-10 | Disposition: A | Discharge status: Home / Self Care | Source: Ambulatory Visit | Attending: Family Medicine | Admitting: Family Medicine

## 2024-05-10 DIAGNOSIS — R221 Localized swelling, mass and lump, neck: Secondary | ICD-10-CM | POA: Diagnosis not present

## 2024-06-18 ENCOUNTER — Encounter (HOSPITAL_COMMUNITY): Payer: Self-pay | Admitting: Specialist

## 2024-06-18 DIAGNOSIS — G4452 New daily persistent headache (NDPH): Secondary | ICD-10-CM | POA: Diagnosis not present

## 2024-06-19 ENCOUNTER — Other Ambulatory Visit (HOSPITAL_COMMUNITY): Payer: Self-pay | Admitting: Specialist

## 2024-06-19 DIAGNOSIS — Z8249 Family history of ischemic heart disease and other diseases of the circulatory system: Secondary | ICD-10-CM

## 2024-06-19 DIAGNOSIS — R519 Headache, unspecified: Secondary | ICD-10-CM

## 2024-06-24 DIAGNOSIS — G4452 New daily persistent headache (NDPH): Secondary | ICD-10-CM | POA: Diagnosis not present

## 2024-06-24 DIAGNOSIS — M791 Myalgia, unspecified site: Secondary | ICD-10-CM | POA: Diagnosis not present

## 2024-06-24 DIAGNOSIS — M542 Cervicalgia: Secondary | ICD-10-CM | POA: Diagnosis not present

## 2024-06-24 DIAGNOSIS — Z79899 Other long term (current) drug therapy: Secondary | ICD-10-CM | POA: Diagnosis not present

## 2024-06-26 ENCOUNTER — Ambulatory Visit (HOSPITAL_COMMUNITY)
Admission: RE | Admit: 2024-06-26 | Discharge: 2024-06-26 | Disposition: A | Discharge status: Home / Self Care | Source: Ambulatory Visit | Attending: Specialist | Admitting: Specialist

## 2024-06-26 ENCOUNTER — Ambulatory Visit (HOSPITAL_COMMUNITY)
Admission: RE | Admit: 2024-06-26 | Discharge: 2024-06-26 | Disposition: A | Source: Ambulatory Visit | Attending: Specialist | Admitting: Specialist

## 2024-06-26 DIAGNOSIS — R519 Headache, unspecified: Secondary | ICD-10-CM | POA: Diagnosis not present

## 2024-06-26 DIAGNOSIS — I6523 Occlusion and stenosis of bilateral carotid arteries: Secondary | ICD-10-CM | POA: Insufficient documentation

## 2024-06-26 DIAGNOSIS — Z8249 Family history of ischemic heart disease and other diseases of the circulatory system: Secondary | ICD-10-CM | POA: Diagnosis not present

## 2024-06-26 DIAGNOSIS — I6623 Occlusion and stenosis of bilateral posterior cerebral arteries: Secondary | ICD-10-CM | POA: Diagnosis not present

## 2024-06-26 DIAGNOSIS — I6603 Occlusion and stenosis of bilateral middle cerebral arteries: Secondary | ICD-10-CM | POA: Insufficient documentation

## 2024-06-26 DIAGNOSIS — R9082 White matter disease, unspecified: Secondary | ICD-10-CM | POA: Insufficient documentation

## 2024-06-26 DIAGNOSIS — I6612 Occlusion and stenosis of left anterior cerebral artery: Secondary | ICD-10-CM | POA: Diagnosis not present

## 2024-06-26 MED ORDER — GADOBUTROL 1 MMOL/ML IV SOLN
10.0000 mL | Freq: Once | INTRAVENOUS | Status: AC | PRN
Start: 2024-06-26 — End: 2024-06-26
  Administered 2024-06-26: 10 mL via INTRAVENOUS

## 2024-07-09 ENCOUNTER — Encounter: Payer: Self-pay | Admitting: Neurology

## 2024-07-09 ENCOUNTER — Ambulatory Visit: Admitting: Neurology

## 2024-07-09 VITALS — BP 107/77 | HR 91 | Ht 68.0 in | Wt 200.6 lb

## 2024-07-09 DIAGNOSIS — M542 Cervicalgia: Secondary | ICD-10-CM | POA: Diagnosis not present

## 2024-07-09 DIAGNOSIS — Z8673 Personal history of transient ischemic attack (TIA), and cerebral infarction without residual deficits: Secondary | ICD-10-CM | POA: Diagnosis not present

## 2024-07-09 DIAGNOSIS — M791 Myalgia, unspecified site: Secondary | ICD-10-CM | POA: Diagnosis not present

## 2024-07-09 DIAGNOSIS — F17200 Nicotine dependence, unspecified, uncomplicated: Secondary | ICD-10-CM

## 2024-07-09 DIAGNOSIS — I669 Occlusion and stenosis of unspecified cerebral artery: Secondary | ICD-10-CM | POA: Diagnosis not present

## 2024-07-09 DIAGNOSIS — Z9189 Other specified personal risk factors, not elsewhere classified: Secondary | ICD-10-CM

## 2024-07-09 DIAGNOSIS — G4452 New daily persistent headache (NDPH): Secondary | ICD-10-CM | POA: Diagnosis not present

## 2024-07-09 MED ORDER — CLOPIDOGREL BISULFATE 75 MG PO TABS
75.0000 mg | ORAL_TABLET | Freq: Every day | ORAL | 11 refills | Status: AC
Start: 2024-07-09 — End: ?

## 2024-07-09 NOTE — Progress Notes (Signed)
 [facility] [address] [place]. [place] [address] [phone]       OFFICE CONSULT NOTE  [name] Date of Birth:  1956/06/15 Medical Record Number:  [id number]   Referring MD: Layman Meissner  Reason for Referral: Abnormal MRA  HPI: [name] is a 68 year old African-American male is seen today for initial office consultation visit.  He is today accompanied by his wife.  History is obtained from them and review of referral notes and electronic medical records.  I personally reviewed pertinent available imaging films in PACS.  He has past medical history of hypertension, arthritis, sciatica and degenerative cervical spine disease.  The patient developed new onset daily headaches for the last 3 to 4 months for which he was being evaluated by Dr. Meissner who ordered an MRI scan of the brain and MR angiogram with abnormal results prompting this consult.  The patient's headaches were felt to be of mixed migraine and tension features and he was being treated with multiple injections and trigger points.  MRI scan of the brain on 06/26/2020 shows no acute abnormalities with changes of chronic small vessel disease and mild generalized atrophy.  MR angiogram of the brain shows near occlusive severe stenosis of proximal left M1 segment with patent distal M2 branches.  There is also severe stenosis noted in the P2 segment of the left posterior cerebral arteries as well as critical stenosis of proximal P2 segment of the right posterior cerebral artery as well.  There is moderate to severe tandem stenosis of the left atrial and mild to moderate stenosis of cavernous and supraclinoid ICAs bilaterally.  There is mild to moderate stenosis of distal right M1 segment.  Patient was felt to be at high risk for stroke and referred to me urgently for evaluation.  However these findings are not new and were noted on the previous CT angiogram on 03/16/2020 and perhaps was slightly more  progressed.  Patient at present denies any focal neurovascular symptoms suggestive of TIA or stroke.  He was actually admitted in June 2021 for evaluation for sudden onset of dizziness, left facial droop and left upper extremity weakness with NIH stroke scale of 1.  MRI at that time had shown tiny bilateral infarcts and CT angiogram had shown severe proximal left M1, moderate to severe bilateral A2 and P2 and moderate to severe multifocal distal MCA branch stenosis as well.  He was placed on dual antiplatelet therapy for 3 months and then aspirin  alone.  LDL cholesterol 119 mg percent and hemoglobin A1c 5.8.  Echocardiogram showed normal ejection fraction with no cardiac source of embolism.  She was advised to quit smoking and limit alcohol use and follow-up for an outpatient sleep apnea evaluation.  Patient was seen once follow-up in office on 04/27/2020 with the same need for aggressive risk factor modification was reinforced however the patient was lost to follow-up.  He states he continues to smoke.  He has been taking aspirin .  Denies any new stroke or TIA symptoms.  He states his headaches are better after being treated by Dr. Meissner.  His blood pressure is well-controlled and today it is 107/77.  He is tolerating Crestor well without muscle aches and pains.  Tolerating aspirin  well without bruising or bleeding.  ROS:   14 system review of systems is positive for headaches and all other systems negative  PMH:  Past Medical History:  Diagnosis Date   Arthritis    Chronic back pain  DDD (degenerative disc disease), cervical    Hypertension    Sciatica    Stroke [facility]) 2022    Social History:  Social History   Socioeconomic History   Marital status: Married    Spouse name: Not on file   Number of children: Not on file   Years of education: Not on file   Highest education level: Not on file  Occupational History   Not on file  Tobacco Use   Smoking status: Every Day    Current  packs/day: 0.50    Types: Cigarettes   Smokeless tobacco: Never  Vaping Use   Vaping status: Never Used  Substance and Sexual Activity   Alcohol use: Yes    Comment: on football days   Drug use: No   Sexual activity: Not on file  Other Topics Concern   Not on file  Social History Narrative   Not on file   Social Drivers of Health   Financial Resource Strain: Not on file  Food Insecurity: Not on file  Transportation Needs: Not on file  Physical Activity: Not on file  Stress: Not on file  Social Connections: Not on file  Intimate Partner Violence: Not on file    Medications:   Current Outpatient Medications on File Prior to Visit  Medication Sig Dispense Refill   allopurinol (ZYLOPRIM) 100 MG tablet Take 100 mg by mouth daily.     amLODipine  (NORVASC ) 10 MG tablet Take 10 mg by mouth every evening.   2   aspirin  EC 81 MG EC tablet Take 1 tablet (81 mg total) by mouth daily. Swallow whole. 30 tablet 11   baclofen (LIORESAL) 10 MG tablet Take 10 mg by mouth.     colchicine 0.6 MG tablet Take 0.6 mg by mouth daily.     gabapentin  (NEURONTIN ) 300 MG capsule TAKE 1 CAPSULE(300 MG) BY MOUTH THREE TIMES DAILY 90 capsule 5   sildenafil (VIAGRA) 50 MG tablet Take 50 mg by mouth as needed for erectile dysfunction.     valsartan -hydrochlorothiazide  (DIOVAN -HCT) 160-25 MG tablet Take 1 tablet by mouth every morning.      zonisamide (ZONEGRAN) 25 MG capsule Take 100 mg by mouth daily.     rosuvastatin (CRESTOR) 20 MG tablet Take 20 mg by mouth daily. (Patient not taking: Reported on 07/09/2024)     No current facility-administered medications on file prior to visit.    Allergies:  No Known Allergies  Physical Exam General: well developed, well nourished middle-aged African-American male, seated, in no evident distress Head: head normocephalic and atraumatic.   Neck: supple with no carotid or supraclavicular bruits Cardiovascular: regular rate and rhythm, no murmurs Musculoskeletal:  no deformity Skin:  no rash/petichiae Vascular:  Normal pulses all extremities  Neurologic Exam Mental Status: Awake and fully alert. Oriented to place and time. Recent and remote memory intact. Attention span, concentration and fund of knowledge appropriate. Mood and affect appropriate.  Cranial Nerves: Fundoscopic exam reveals sharp disc margins. Pupils equal, briskly reactive to light. Extraocular movements full without nystagmus. Visual fields full to confrontation. Hearing intact. Facial sensation intact. Face, tongue, palate moves normally and symmetrically.  Motor: Normal bulk and tone. Normal strength in all tested extremity muscles. Sensory.: intact to touch , pinprick , position and vibratory sensation.  Coordination: Rapid alternating movements normal in all extremities. Finger-to-nose and heel-to-shin performed accurately bilaterally. Gait and Station: Arises from chair without difficulty. Stance is normal. Gait demonstrates normal stride length and balance . Able to heel,  toe and tandem walk without difficulty.  Reflexes: 1+ and symmetric. Toes downgoing.   NIHSS  0 Modified Rankin  0   ASSESSMENT: 68 year old African-American male with multi focal multivessel intracranial stenosis which has progressed from previous study from 2021 on recent MRA but fortunately he has not had any definite stroke or focal stroke/TIA symptoms despite uncontrolled risk factors.  Remote history of small left parietal infarct and 2021 with vascular risk factors of smoking, hypertension, hyperlipidemia, alcohol use at risk for sleep apnea and prior stroke.  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     PLAN:I had a long discussion with patient and his wife regarding his severe progressive multivessel intracranial stenosis, prior history of stroke and significant risk for future strokes.  I recommend he change aspirin  to Plavix  75 mg daily  for secondary stroke prevention and maintain aggressive risk factor modification with strict control of hypertension with blood pressure goal below 130/90, lipids with LDL cholesterol goal below 70 mg percent and diabetes with hemoglobin A1c goal below 6.5%.  He was also counseled to quit smoking completely and seek help from primary care physician for the same.  I encouraged him to eat a healthy diet with lots of fruits, vegetables, cereals, whole grains and to be active and exercise regularly.  Refer to sleep clinic for polysomnogram for sleep apnea as he appears to be at risk.  Continue follow-up for his headache management with Dr. Oneita.  Return for follow-up in the future in 6 to 8 months or call earlier if necessary.   I personally spent a total of 50 minutes in the care of the patient today including getting/reviewing separately obtained history, performing a medically appropriate exam/evaluation, counseling and educating, placing orders, referring and communicating with other health care professionals, documenting clinical information in the EHR, independently interpreting results, and coordinating care.        Eather Popp, MD Note: This document was prepared with digital dictation and possible smart phrase technology. Any transcriptional errors that result from this process are unintentional.

## 2024-07-09 NOTE — Patient Instructions (Signed)
 I had a long discussion with patient and his wife regarding his severe progressive multivessel intracranial stenosis, prior history of stroke and significant risk for future strokes.  I recommend he change aspirin  to Plavix  75 mg daily for secondary stroke prevention and maintain aggressive risk factor modification with strict control of hypertension with blood pressure goal below 130/90, lipids with LDL cholesterol goal below 70 mg percent and diabetes with hemoglobin A1c goal below 6.5%.  He was also counseled to quit smoking completely and seek help from primary care physician for the same.  I encouraged him to eat a healthy diet with lots of fruits, vegetables, cereals, whole grains and to be active and exercise regularly.  Refer to sleep clinic for polysomnogram for sleep apnea as he appears to be at risk.  Return for follow-up in the future in 6 to 8 months or call earlier if necessary.  Stroke Prevention Some medical conditions and behaviors can lead to a higher chance of having a stroke. You can help prevent a stroke by eating healthy, exercising, not smoking, and managing any medical conditions you have. Stroke is a leading cause of functional impairment. Primary prevention is particularly important because a majority of strokes are first-time events. Stroke changes the lives of not only those who experience a stroke but also their family and other caregivers. How can this condition affect me? A stroke is a medical emergency and should be treated right away. A stroke can lead to brain damage and can sometimes be life-threatening. If a person gets medical treatment right away, there is a better chance of surviving and recovering from a stroke. What can increase my risk? The following medical conditions may increase your risk of a stroke: Cardiovascular disease. High blood pressure (hypertension). Diabetes. High cholesterol. Sickle cell disease. Blood clotting disorders (hypercoagulable  state). Obesity. Sleep disorders (obstructive sleep apnea). Other risk factors include: Being older than age 61. Having a history of blood clots, stroke, or mini-stroke (transient ischemic attack, TIA). Genetic factors, such as race, ethnicity, or a family history of stroke. Smoking cigarettes or using other tobacco products. Taking birth control pills, especially if you also use tobacco. Heavy use of alcohol or drugs, especially cocaine and methamphetamine. Physical inactivity. What actions can I take to prevent this? Manage your health conditions High cholesterol levels. Eating a healthy diet is important for preventing high cholesterol. If cholesterol cannot be managed through diet alone, you may need to take medicines. Take any prescribed medicines to control your cholesterol as told by your health care provider. Hypertension. To reduce your risk of stroke, try to keep your blood pressure below 130/80. Eating a healthy diet and exercising regularly are important for controlling blood pressure. If these steps are not enough to manage your blood pressure, you may need to take medicines. Take any prescribed medicines to control hypertension as told by your health care provider. Ask your health care provider if you should monitor your blood pressure at home. Have your blood pressure checked every year, even if your blood pressure is normal. Blood pressure increases with age and some medical conditions. Diabetes. Eating a healthy diet and exercising regularly are important parts of managing your blood sugar (glucose). If your blood sugar cannot be managed through diet and exercise, you may need to take medicines. Take any prescribed medicines to control your diabetes as told by your health care provider. Get evaluated for obstructive sleep apnea. Talk to your health care provider about getting a sleep evaluation if  you snore a lot or have excessive sleepiness. Make sure that any other  medical conditions you have, such as atrial fibrillation or atherosclerosis, are managed. Nutrition Follow instructions from your health care provider about what to eat or drink to help manage your health condition. These instructions may include: Reducing your daily calorie intake. Limiting how much salt (sodium) you use to 1,500 milligrams (mg) each day. Using only healthy fats for cooking, such as olive oil, canola oil, or sunflower oil. Eating healthy foods. You can do this by: Choosing foods that are high in fiber, such as whole grains, and fresh fruits and vegetables. Eating at least 5 servings of fruits and vegetables a day. Try to fill one-half of your plate with fruits and vegetables at each meal. Choosing lean protein foods, such as lean cuts of meat, poultry without skin, fish, tofu, beans, and nuts. Eating low-fat dairy products. Avoiding foods that are high in sodium. This can help lower blood pressure. Avoiding foods that have saturated fat, trans fat, and cholesterol. This can help prevent high cholesterol. Avoiding processed and prepared foods. Counting your daily carbohydrate intake.  Lifestyle If you drink alcohol: Limit how much you have to: 0-1 drink a day for women who are not pregnant. 0-2 drinks a day for men. Know how much alcohol is in your drink. In the [place] one drink equals one 12 oz bottle of beer ( ), one 5 oz glass of wine ( ), or one 1 oz glass of hard liquor (44mL). Do not use any products that contain nicotine  or tobacco. These products include cigarettes, chewing tobacco, and vaping devices, such as e-cigarettes. If you need help quitting, ask your health care provider. Avoid secondhand smoke. Do not use drugs. Activity  Try to stay at a healthy weight. Get at least 30 minutes of exercise on most days, such as: Fast walking. Biking. Swimming. Medicines Take over-the-counter and prescription medicines only as told by your health care  provider. Aspirin  or blood thinners (antiplatelets or anticoagulants) may be recommended to reduce your risk of forming blood clots that can lead to stroke. Avoid taking birth control pills. Talk to your health care provider about the risks of taking birth control pills if: You are over 28 years old. You smoke. You get very bad headaches. You have had a blood clot. Where to find more information American Stroke Association: www.strokeassociation.org Get help right away if: You or a loved one has any symptoms of a stroke. BE FAST is an easy way to remember the main warning signs of a stroke: B - Balance. Signs are dizziness, sudden trouble walking, or loss of balance. E - Eyes. Signs are trouble seeing or a sudden change in vision. F - Face. Signs are sudden weakness or numbness of the face, or the face or eyelid drooping on one side. A - Arms. Signs are weakness or numbness in an arm. This happens suddenly and usually on one side of the body. S - Speech. Signs are sudden trouble speaking, slurred speech, or trouble understanding what people say. T - Time. Time to call emergency services. Write down what time symptoms started. You or a loved one has other signs of a stroke, such as: A sudden, severe headache with no known cause. Nausea or vomiting. Seizure. These symptoms may represent a serious problem that is an emergency. Do not wait to see if the symptoms will go away. Get medical help right away. Call your local emergency services (911 in the [place] Do  not drive yourself to the hospital. Summary You can help to prevent a stroke by eating healthy, exercising, not smoking, limiting alcohol intake, and managing any medical conditions you may have. Do not use any products that contain nicotine  or tobacco. These include cigarettes, chewing tobacco, and vaping devices, such as e-cigarettes. If you need help quitting, ask your health care provider. Remember BE FAST for warning signs of a  stroke. Get help right away if you or a loved one has any of these signs. This information is not intended to replace advice given to you by your health care provider. Make sure you discuss any questions you have with your health care provider. Document Revised: 08/15/2022 Document Reviewed: 08/15/2022 Elsevier Patient Education  2024 [employer].

## 2024-07-10 LAB — LIPID PANEL
Chol/HDL Ratio: 3.9 ratio (ref 0.0–5.0)
Cholesterol, Total: 110 mg/dL (ref 100–199)
HDL: 28 mg/dL — ABNORMAL LOW (ref >39)
LDL Chol Calc (NIH): 59 mg/dL (ref 0–99)
Triglycerides: 127 mg/dL (ref 0–149)
VLDL Cholesterol Cal: 23 mg/dL (ref 5–40)

## 2024-07-10 LAB — HEMOGLOBIN A1C
Est. average glucose Bld gHb Est-mCnc: 134 mg/dL
Hgb A1c MFr Bld: 6.3 % — ABNORMAL HIGH (ref 4.8–5.6)

## 2024-07-12 DIAGNOSIS — R69 Illness, unspecified: Secondary | ICD-10-CM | POA: Diagnosis not present

## 2024-07-14 ENCOUNTER — Ambulatory Visit: Payer: Self-pay | Admitting: Neurology

## 2024-07-15 DIAGNOSIS — F17210 Nicotine dependence, cigarettes, uncomplicated: Secondary | ICD-10-CM | POA: Diagnosis not present

## 2024-07-18 ENCOUNTER — Ambulatory Visit (HOSPITAL_COMMUNITY)
Admission: RE | Admit: 2024-07-18 | Discharge: 2024-07-18 | Disposition: A | Discharge status: Home / Self Care | Source: Ambulatory Visit | Attending: Family Medicine | Admitting: Family Medicine

## 2024-07-18 DIAGNOSIS — Z8673 Personal history of transient ischemic attack (TIA), and cerebral infarction without residual deficits: Secondary | ICD-10-CM | POA: Insufficient documentation

## 2024-07-18 DIAGNOSIS — I669 Occlusion and stenosis of unspecified cerebral artery: Secondary | ICD-10-CM | POA: Diagnosis not present

## 2024-07-18 DIAGNOSIS — I6523 Occlusion and stenosis of bilateral carotid arteries: Secondary | ICD-10-CM

## 2024-07-18 NOTE — Progress Notes (Signed)
 VASCULAR LAB    Carotid duplex has been performed.  See CV proc for preliminary results.   Nelson Julson, RVT 07/18/2024, 11:42 AM

## 2024-07-29 ENCOUNTER — Emergency Department (HOSPITAL_COMMUNITY)

## 2024-07-29 ENCOUNTER — Other Ambulatory Visit: Payer: Self-pay

## 2024-07-29 ENCOUNTER — Encounter (HOSPITAL_COMMUNITY): Payer: Self-pay

## 2024-07-29 ENCOUNTER — Emergency Department (HOSPITAL_COMMUNITY): Admission: EM | Admit: 2024-07-29 | Discharge: 2024-07-29 | Disposition: A | Discharge status: Home / Self Care

## 2024-07-29 DIAGNOSIS — I10 Essential (primary) hypertension: Secondary | ICD-10-CM | POA: Insufficient documentation

## 2024-07-29 DIAGNOSIS — R0781 Pleurodynia: Secondary | ICD-10-CM | POA: Diagnosis present

## 2024-07-29 DIAGNOSIS — Z79899 Other long term (current) drug therapy: Secondary | ICD-10-CM | POA: Insufficient documentation

## 2024-07-29 DIAGNOSIS — M5412 Radiculopathy, cervical region: Secondary | ICD-10-CM | POA: Insufficient documentation

## 2024-07-29 DIAGNOSIS — Z7901 Long term (current) use of anticoagulants: Secondary | ICD-10-CM | POA: Insufficient documentation

## 2024-07-29 DIAGNOSIS — R0782 Intercostal pain: Secondary | ICD-10-CM | POA: Insufficient documentation

## 2024-07-29 LAB — BASIC METABOLIC PANEL WITH GFR
Anion gap: 12 (ref 5–15)
BUN: 24 mg/dL — ABNORMAL HIGH (ref 8–23)
CO2: 27 mmol/L (ref 22–32)
Calcium: 9.6 mg/dL (ref 8.9–10.3)
Chloride: 102 mmol/L (ref 98–111)
Creatinine, Ser: 1.54 mg/dL — ABNORMAL HIGH (ref 0.61–1.24)
GFR, Estimated: 50 mL/min — ABNORMAL LOW (ref >60)
Glucose, Bld: 98 mg/dL (ref 70–99)
Potassium: 3.6 mmol/L (ref 3.5–5.1)
Sodium: 140 mmol/L (ref 135–145)

## 2024-07-29 LAB — CBC
HCT: 39 % (ref 39.0–52.0)
Hemoglobin: 13.3 g/dL (ref 13.0–17.0)
MCH: 31.3 pg (ref 26.0–34.0)
MCHC: 34.1 g/dL (ref 30.0–36.0)
MCV: 91.8 fL (ref 80.0–100.0)
Platelets: 321 K/uL (ref 150–400)
RBC: 4.25 MIL/uL (ref 4.22–5.81)
RDW: 13.9 % (ref 11.5–15.5)
WBC: 8.2 K/uL (ref 4.0–10.5)
nRBC: 0 % (ref 0.0–0.2)

## 2024-07-29 LAB — TROPONIN T, HIGH SENSITIVITY
Troponin T High Sensitivity: 21 ng/L — ABNORMAL HIGH (ref 0–19)
Troponin T High Sensitivity: 20 ng/L — ABNORMAL HIGH (ref 0–19)

## 2024-07-29 LAB — D-DIMER, QUANTITATIVE: D-Dimer, Quant: 0.4 ug{FEU}/mL (ref 0.00–0.50)

## 2024-07-29 MED ORDER — FAMOTIDINE 20 MG PO TABS: 20.0000 mg | ORAL_TABLET | Freq: Two times a day (BID) | ORAL | 0 refills | Status: AC

## 2024-07-29 NOTE — Discharge Instructions (Addendum)
 Please follow-up with cardiology.  They will give you a call and establish care.  You do have risk factors for cardiac disease so I do want to follow-up with them outpatient. If experiencing, worsening chest pain or shortness of breath and please come back to the ED for further evaluation.   Please pick the prescription to see if this helps.  Could be more GERD related as well.

## 2024-07-29 NOTE — ED Provider Notes (Signed)
 [place] [facility] AT [facility] Provider Note   CSN: [id number] Arrival date & time: 07/29/24  1910     Patient presents with: Chest Pain   Carl Garza is a 68 y.o. male.    Chest Pain   Presents because of chest pain.  Patient has been having chest pain over the past 2 days.  Patient states that hurts whenever he moves in certain directions.  Patient states that hurts more inside of his chest during certain movements.  Also endorsing some slight pleuritic chest pain.  No history of DVT or PE.  No unilateral leg swelling.  No hemoptysis.  Patient denies any ACS history.  Patient also endorses episodes of tingling in his left arm and hand.  Patient states has had this in the past related to his cervical disc disease.  No other numbness or tingling aware.  No vision changes.  No speech changes.  No diplopia or or vision changes.   Previous medical history reviewed : past medical history of hypertension, arthritis, sciatica and degenerative cervical spine disease       Prior to Admission medications   Medication Sig Start Date End Date Taking? Authorizing Provider  famotidine (PEPCID) 20 MG tablet Take 1 tablet (20 mg total) by mouth 2 (two) times daily. 07/29/24  Yes Simon Lavonia SAILOR, MD  allopurinol (ZYLOPRIM) 100 MG tablet Take 100 mg by mouth daily.    [provider]  amLODipine  (NORVASC ) 10 MG tablet Take 10 mg by mouth every evening.  09/23/17   [provider]  baclofen (LIORESAL) 10 MG tablet Take 10 mg by mouth. 06/18/24   [provider]  clopidogrel  (PLAVIX ) 75 MG tablet Take 1 tablet (75 mg total) by mouth daily. 07/09/24   Sethi, Pramod S, MD  colchicine 0.6 MG tablet Take 0.6 mg by mouth daily.    [provider]  gabapentin  (NEURONTIN ) 300 MG capsule TAKE 1 CAPSULE(300 MG) BY MOUTH THREE TIMES DAILY 10/21/21   Margrette Taft BRAVO, MD  rosuvastatin (CRESTOR) 20 MG tablet Take 20 mg by mouth daily. Patient not  taking: Reported on 07/09/2024    [provider]  sildenafil (VIAGRA) 50 MG tablet Take 50 mg by mouth as needed for erectile dysfunction.    [provider]  valsartan -hydrochlorothiazide  (DIOVAN -HCT) 160-25 MG tablet Take 1 tablet by mouth every morning.     [provider]  zonisamide (ZONEGRAN) 25 MG capsule Take 100 mg by mouth daily. 06/18/24   [provider]    Allergies: Patient has no known allergies.    Review of Systems  Cardiovascular:  Positive for chest pain.    Updated Vital Signs BP 105/85   Pulse 79   Temp 98.6 F (37 C) (Oral)   Resp 18   Ht 5' 8 (1.727 m)   Wt 91 kg   SpO2 94%   BMI 30.50 kg/m   Physical Exam Vitals and nursing note reviewed.  Constitutional:      General: He is not in acute distress.    Appearance: He is well-developed.  HENT:     Head: Normocephalic and atraumatic.  Eyes:     Conjunctiva/sclera: Conjunctivae normal.  Cardiovascular:     Rate and Rhythm: Normal rate and regular rhythm.     Heart sounds: No murmur heard. Pulmonary:     Effort: Pulmonary effort is normal. No respiratory distress.     Breath sounds: Normal breath sounds.  Abdominal:  Palpations: Abdomen is soft.     Tenderness: There is no abdominal tenderness.  Musculoskeletal:        General: No swelling.       Arms:     Cervical back: Neck supple.  Skin:    General: Skin is warm and dry.     Capillary Refill: Capillary refill takes less than 2 seconds.  Neurological:     Mental Status: He is alert.  Psychiatric:        Mood and Affect: Mood normal.     (all labs ordered are listed, but only abnormal results are displayed) Labs Reviewed  BASIC METABOLIC PANEL WITH GFR - Abnormal; Notable for the following components:      Result Value   BUN 24 (*)    Creatinine, Ser 1.54 (*)    GFR, Estimated 50 (*)    All other components within normal limits  TROPONIN T, HIGH SENSITIVITY - Abnormal; Notable for the following  components:   Troponin T High Sensitivity 21 (*)    All other components within normal limits  TROPONIN T, HIGH SENSITIVITY - Abnormal; Notable for the following components:   Troponin T High Sensitivity 20 (*)    All other components within normal limits  CBC  D-DIMER, QUANTITATIVE    EKG: None  Radiology: DG Chest 2 View Result Date: 07/29/2024 CLINICAL DATA:  Chest pain for 2 days EXAM: CHEST - 2 VIEW COMPARISON:  04/23/2020 FINDINGS: The heart size and mediastinal contours are within normal limits. Both lungs are clear. The visualized skeletal structures are unremarkable. IMPRESSION: No active cardiopulmonary disease. Electronically Signed   By: Ozell Daring M.D.   On: 07/29/2024 19:39     Procedures   Medications Ordered in the ED - No data to display                                  Medical Decision Making Amount and/or Complexity of Data Reviewed Labs: ordered. Radiology: ordered.     HPI:   Presents because of chest pain.  Patient has been having chest pain over the past 2 days.  Patient states that hurts whenever he moves in certain directions.  Patient states that hurts more inside of his chest during certain movements.  Also endorsing some slight pleuritic chest pain.  No history of DVT or PE.  No unilateral leg swelling.  No hemoptysis.  Patient denies any ACS history.  Patient also endorses episodes of tingling in his left arm and hand.  Patient states has had this in the past related to his cervical disc disease.  No other numbness or tingling aware.  No vision changes.  No speech changes.  No diplopia or or vision changes.   Previous medical history reviewed : past medical history of hypertension, arthritis, sciatica and degenerative cervical spine disease     MDM:   Upon exam, patient ANO x 3 with GCS 15.  No focal deficits.  Cranial nerves 212 intact.  Upon reexamination, patient hemodynamically stable.  Remains A&O x 3 with GCS 15.  In terms of  patient's left arm tingling at times.  Feel like is likely from his cervical radiculopathy.  This has been documented in the past.  Patient is neurovascularly and neurologically intact at this moment time.  Strength and sensation intact in his left upper extremity.  2+ radial pulses.  No concerns for any kind of dissection causing patient's  chest pain as well as tingling in his arm. No  Indication for any kind of neuro imaging at this time.  No indication for cervical imaging.  In regards to patient's chest pain.  Reproducible in nature.  No ACS history.  No cardiology history.  Hurts with certain movements as well which makes it more likely to be musculoskeletal in nature.  Nevertheless, will obtain EKG as well as ACS workup with troponin x 2.  Will refer to cardiology if negative.  D-dimer obtained given pleuritic chest pain.  Otherwise, low risk from a [place] standpoint as well as PERC rule.  Reevaluation:    EKG x 2 shows no changes.  No hyperdynamic changes.  Troponin x 2 unremarkable.  Patient will follow-up with cardiology outpatient.  He does have risk factors so reasonable to follow-up outpatient.   Did obtain a D-dimer given some pleuritic aspect of the chest pain.  Negative.  No other risk factors for DVT or PE.  No indication for CTA of the chest.  Chest x-ray benign.  Once again, I do think this is more musculoskeletal given reproducibility of it.  Patient's question whether or not could be GERD related so was started on famotidine to see if this helps and I think this is unlikely.  Recommended ongoing symptomatic management for cervical radiculopathy as well.  I think he is safe for discharge home.  Strict return precautions given.      EKG Interpreted by Me: no STEMI    Cardiac Tele Interpreted by Me: No arrythmia    I have independently interpreted the CXR images and agree with the radiologist finding   Social Determinant of Health: Tobacco abuse   Disposition and  Follow Up: Cardiology       Final diagnoses:  Intercostal pain  Pleuritic chest pain  Cervical radiculopathy    ED Discharge Orders          Ordered    Ambulatory referral to Cardiology        07/29/24 2227    famotidine (PEPCID) 20 MG tablet  2 times daily        07/29/24 2251               Simon Lavonia SAILOR, MD 07/29/24 2255

## 2024-07-29 NOTE — ED Triage Notes (Addendum)
 Pt to ED from home with c/o chest pain that started two days that has gotten worse, also reports intermittent left arm tingling/numbness. Pt denies any other symptoms. Pt does say pain increases with movement anything on left side

## 2024-08-05 ENCOUNTER — Institutional Professional Consult (permissible substitution): Admitting: Neurology

## 2024-08-19 ENCOUNTER — Encounter: Payer: Self-pay | Admitting: Family Medicine
